# Patient Record
Sex: Female | Born: 1990 | Race: Black or African American | Hispanic: No | Marital: Single | Smoking: Never smoker
Health system: Southern US, Community
[De-identification: ages and names within clinical notes are randomized; demographics above are authoritative.]

## PROBLEM LIST (undated history)

## (undated) DIAGNOSIS — S060XAA Concussion with loss of consciousness status unknown, initial encounter: Secondary | ICD-10-CM

## (undated) DIAGNOSIS — F329 Major depressive disorder, single episode, unspecified: Secondary | ICD-10-CM

## (undated) DIAGNOSIS — M5020 Other cervical disc displacement, unspecified cervical region: Secondary | ICD-10-CM

## (undated) DIAGNOSIS — F32A Depression, unspecified: Secondary | ICD-10-CM

## (undated) DIAGNOSIS — R011 Cardiac murmur, unspecified: Secondary | ICD-10-CM

## (undated) DIAGNOSIS — R51 Headache: Secondary | ICD-10-CM

## (undated) DIAGNOSIS — S060X9A Concussion with loss of consciousness of unspecified duration, initial encounter: Secondary | ICD-10-CM

## (undated) DIAGNOSIS — R519 Headache, unspecified: Secondary | ICD-10-CM

## (undated) DIAGNOSIS — K219 Gastro-esophageal reflux disease without esophagitis: Secondary | ICD-10-CM

## (undated) DIAGNOSIS — F419 Anxiety disorder, unspecified: Secondary | ICD-10-CM

## (undated) HISTORY — DX: Concussion with loss of consciousness status unknown, initial encounter: S06.0XAA

## (undated) HISTORY — PX: WISDOM TOOTH EXTRACTION: SHX21

## (undated) HISTORY — DX: Other cervical disc displacement, unspecified cervical region: M50.20

## (undated) HISTORY — DX: Concussion with loss of consciousness of unspecified duration, initial encounter: S06.0X9A

## (undated) HISTORY — DX: Headache, unspecified: R51.9

## (undated) HISTORY — DX: Major depressive disorder, single episode, unspecified: F32.9

## (undated) HISTORY — DX: Gastro-esophageal reflux disease without esophagitis: K21.9

## (undated) HISTORY — PX: TONSILLECTOMY: SUR1361

## (undated) HISTORY — DX: Anxiety disorder, unspecified: F41.9

## (undated) HISTORY — DX: Headache: R51

## (undated) HISTORY — DX: Depression, unspecified: F32.A

---
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
## 2012-10-18 ENCOUNTER — Emergency Department (HOSPITAL_BASED_OUTPATIENT_CLINIC_OR_DEPARTMENT_OTHER)
Admission: EM | Admit: 2012-10-18 | Discharge: 2012-10-18 | Disposition: A | Discharge status: Home / Self Care | Payer: Self-pay | Attending: Emergency Medicine | Admitting: Emergency Medicine

## 2012-10-18 ENCOUNTER — Encounter (HOSPITAL_BASED_OUTPATIENT_CLINIC_OR_DEPARTMENT_OTHER): Payer: Self-pay | Admitting: *Deleted

## 2012-10-18 DIAGNOSIS — J029 Acute pharyngitis, unspecified: Secondary | ICD-10-CM

## 2012-10-18 LAB — RAPID STREP SCREEN (MED CTR MEBANE ONLY): Streptococcus, Group A Screen (Direct): NEGATIVE

## 2012-10-18 MED ORDER — AZITHROMYCIN 250 MG PO TABS: ORAL_TABLET | ORAL | Status: DC

## 2012-10-18 NOTE — ED Notes (Signed)
Sore throat and chills

## 2012-10-18 NOTE — ED Provider Notes (Signed)
History     CSN: [id number]  Arrival date & time 10/18/12  1725   First MD Initiated Contact with Patient 10/18/12 1848      Chief Complaint  Patient presents with  . Sore Throat    (Consider location/radiation/quality/duration/timing/severity/associated sxs/prior treatment) Patient is a 21 y.o. female presenting with pharyngitis. The history is provided by the patient. No language interpreter was used.  Sore Throat This is a new problem. Episode onset: 4 days. The problem occurs constantly. The problem has been gradually worsening. Associated symptoms include a sore throat and swollen glands. Pertinent negatives include no rash. Nothing aggravates the symptoms. She has tried nothing for the symptoms. The treatment provided moderate relief.  Pt complains of a sore throat and sinus drainage.    History reviewed. No pertinent past medical history.  Past Surgical History  Procedure Date  . Tonsillectomy     No family history on file.  History  Substance Use Topics  . Smoking status: Never Smoker   . Smokeless tobacco: Not on file  . Alcohol Use: No    OB History    Grav Para Term Preterm Abortions TAB SAB Ect Mult Living                  Review of Systems  HENT: Positive for sore throat.   Skin: Negative for rash.  All other systems reviewed and are negative.    Allergies  Sulfa antibiotics  Home Medications  No current outpatient prescriptions on file.  BP 113/68  Pulse 91  Temp 98.9 F (37.2 C) (Oral)  Resp 20  Wt 110 lb (49.896 kg)  SpO2 100%  Physical Exam  Nursing note and vitals reviewed. Constitutional: She is oriented to person, place, and time. She appears well-developed and well-nourished.  HENT:  Head: Normocephalic and atraumatic.  Right Ear: External ear normal.  Left Ear: External ear normal.  Nose: Nose normal.  Mouth/Throat: Oropharynx is clear and moist.  Eyes: Conjunctivae normal are normal. Pupils are equal, round, and reactive  to light.  Neck: Normal range of motion. Neck supple.  Cardiovascular: Normal rate, regular rhythm and normal heart sounds.   Pulmonary/Chest: Effort normal and breath sounds normal.  Musculoskeletal: Normal range of motion.  Neurological: She is alert and oriented to person, place, and time. She has normal reflexes.  Skin: Skin is warm.  Psychiatric: She has a normal mood and affect.    ED Course  Procedures (including critical care time)   Labs Reviewed  RAPID STREP SCREEN   No results found.   1. Pharyngitis       MDM   Pt given rx for zithromax       Elson Areas, [place] 10/18/12 1903  Lonia Skinner [place], [place] 10/18/12 1905

## 2012-10-19 NOTE — ED Provider Notes (Signed)
Medical screening examination/treatment/procedure(s) were performed by non-physician practitioner and as supervising physician I was immediately available for consultation/collaboration.   Carleene Cooper III, MD 10/19/12 [phone]

## 2014-09-29 DIAGNOSIS — R011 Cardiac murmur, unspecified: Secondary | ICD-10-CM | POA: Insufficient documentation

## 2014-09-29 DIAGNOSIS — F419 Anxiety disorder, unspecified: Secondary | ICD-10-CM | POA: Insufficient documentation

## 2015-09-12 LAB — HM PAP SMEAR: HM PAP: ABNORMAL

## 2015-11-03 ENCOUNTER — Encounter (HOSPITAL_BASED_OUTPATIENT_CLINIC_OR_DEPARTMENT_OTHER): Payer: Self-pay | Admitting: Emergency Medicine

## 2015-11-03 ENCOUNTER — Emergency Department (HOSPITAL_BASED_OUTPATIENT_CLINIC_OR_DEPARTMENT_OTHER)
Admission: EM | Admit: 2015-11-03 | Discharge: 2015-11-04 | Disposition: A | Discharge status: Home / Self Care | Payer: BLUE CROSS/BLUE SHIELD | Attending: Emergency Medicine | Admitting: Emergency Medicine

## 2015-11-03 ENCOUNTER — Emergency Department (HOSPITAL_BASED_OUTPATIENT_CLINIC_OR_DEPARTMENT_OTHER): Payer: BLUE CROSS/BLUE SHIELD

## 2015-11-03 DIAGNOSIS — R011 Cardiac murmur, unspecified: Secondary | ICD-10-CM | POA: Insufficient documentation

## 2015-11-03 DIAGNOSIS — S299XXA Unspecified injury of thorax, initial encounter: Secondary | ICD-10-CM | POA: Insufficient documentation

## 2015-11-03 DIAGNOSIS — Y92410 Unspecified street and highway as the place of occurrence of the external cause: Secondary | ICD-10-CM | POA: Diagnosis not present

## 2015-11-03 DIAGNOSIS — Z3202 Encounter for pregnancy test, result negative: Secondary | ICD-10-CM | POA: Diagnosis not present

## 2015-11-03 DIAGNOSIS — Y998 Other external cause status: Secondary | ICD-10-CM | POA: Diagnosis not present

## 2015-11-03 DIAGNOSIS — Y9389 Activity, other specified: Secondary | ICD-10-CM | POA: Insufficient documentation

## 2015-11-03 DIAGNOSIS — S0990XA Unspecified injury of head, initial encounter: Secondary | ICD-10-CM | POA: Diagnosis not present

## 2015-11-03 DIAGNOSIS — Z79899 Other long term (current) drug therapy: Secondary | ICD-10-CM | POA: Diagnosis not present

## 2015-11-03 DIAGNOSIS — S4992XA Unspecified injury of left shoulder and upper arm, initial encounter: Secondary | ICD-10-CM | POA: Diagnosis present

## 2015-11-03 DIAGNOSIS — S43102A Unspecified dislocation of left acromioclavicular joint, initial encounter: Secondary | ICD-10-CM | POA: Diagnosis not present

## 2015-11-03 HISTORY — DX: Cardiac murmur, unspecified: R01.1

## 2015-11-03 MED ORDER — IBUPROFEN 800 MG PO TABS
800.0000 mg | ORAL_TABLET | Freq: Once | ORAL | Status: AC
  Administered 2015-11-03: 800 mg via ORAL
  Filled 2015-11-03: qty 1
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
## 2015-11-04 LAB — PREGNANCY, URINE: PREG TEST UR: NEGATIVE

## 2015-11-04 MED ORDER — IBUPROFEN 800 MG PO TABS: 800.0000 mg | ORAL_TABLET | Freq: Three times a day (TID) | ORAL | Status: DC | PRN
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
## 2015-11-09 ENCOUNTER — Encounter: Payer: Self-pay | Admitting: Family Medicine

## 2015-11-09 ENCOUNTER — Ambulatory Visit (INDEPENDENT_AMBULATORY_CARE_PROVIDER_SITE_OTHER): Payer: Self-pay | Admitting: Family Medicine

## 2015-11-09 VITALS — BP 123/76 | HR 87 | Ht 61.0 in | Wt 120.0 lb

## 2015-11-09 DIAGNOSIS — S4992XA Unspecified injury of left shoulder and upper arm, initial encounter: Secondary | ICD-10-CM

## 2015-11-09 MED ORDER — METHOCARBAMOL 500 MG PO TABS: 500.0000 mg | ORAL_TABLET | Freq: Three times a day (TID) | ORAL | Status: DC | PRN

## 2015-11-09 MED ORDER — PREDNISONE 10 MG PO TABS: ORAL_TABLET | ORAL | Status: DC

## 2015-11-09 NOTE — Patient Instructions (Signed)
You suffered a Grade 2 AC sprain and a brachial plexus contusion. This should be the worst your pain is and it will improve over the next few weeks. Wear sling regularly for at least 1 more week. Prednisone dose pack as directed x 6 days - day AFTER finishing this you can restart the motrin. Ice or heat, whichever feels better for 15 minutes at a time. Robaxin as needed for muscle spasms. Shoulder motion exercises as I showed you. Follow up with me in 2 weeks. We will consider adding physical therapy at that time. Out of work in the meantime.

## 2015-11-10 DIAGNOSIS — S4992XA Unspecified injury of left shoulder and upper arm, initial encounter: Secondary | ICD-10-CM | POA: Insufficient documentation
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
## 2015-11-10 NOTE — Assessment & Plan Note (Signed)
she does have mild pain with AC joint testing though no tenderness - Grade 2 AC sprain that should improve well with conservative treatment.  Icing, sling for 1 more week.  Also with symptoms into left arm and weakness consistent with brachial plexus contusion or traction injury.  Start prednisone dose pack, robaxin as needed.  F/u in 2 weeks.  Out of work.  Consider starting PT at follow-up.

## 2015-11-23 ENCOUNTER — Encounter: Payer: Self-pay | Admitting: Family Medicine

## 2015-11-23 ENCOUNTER — Ambulatory Visit (INDEPENDENT_AMBULATORY_CARE_PROVIDER_SITE_OTHER): Payer: Self-pay | Admitting: Family Medicine

## 2015-11-23 VITALS — BP 114/79 | HR 80 | Ht 61.0 in | Wt 125.0 lb

## 2015-11-23 DIAGNOSIS — S060X0A Concussion without loss of consciousness, initial encounter: Secondary | ICD-10-CM

## 2015-11-23 DIAGNOSIS — S4992XD Unspecified injury of left shoulder and upper arm, subsequent encounter: Secondary | ICD-10-CM

## 2015-11-23 MED ORDER — AMITRIPTYLINE HCL 10 MG PO TABS: 10.0000 mg | ORAL_TABLET | Freq: Every day | ORAL | Status: DC

## 2015-11-23 MED ORDER — PROPRANOLOL HCL 60 MG PO TABS: 30.0000 mg | ORAL_TABLET | Freq: Three times a day (TID) | ORAL | Status: DC

## 2015-11-23 NOTE — Patient Instructions (Signed)
Your AC sprain of your shoulder has healed. You still have symptoms from the brachial plexus contusion. Tylenol 500mg  1-2 tabs three times a day as needed for pain. Motrin 600mg  three times a day with food as needed for pain and inflammation. Continue shoulder motion exercises.   Start amitriptyline 10mg  at bedtime for postconcussion syndrome. Start propranolol 30mg  three times a day for headaches. I would recommend picking one of the above to start today and waiting until Wednesday to start the other one so we make sure you don't sustain any side effects and, if you do, we know which of the two medicines it's from. Follow up with me in 2 weeks. Continued out of work.

## 2015-11-25 DIAGNOSIS — S060X0A Concussion without loss of consciousness, initial encounter: Secondary | ICD-10-CM | POA: Insufficient documentation

## 2015-11-25 NOTE — Assessment & Plan Note (Signed)
symptoms severe.  We discussed her options - will start amitriptyline, propranolol for postconcussive syndrome and headaches related to this.  F/u in 2 weeks for reevaluation.

## 2015-11-25 NOTE — Assessment & Plan Note (Signed)
AC sprain improved at this point.  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  No improvement with prednisone, robaxin.  She will continue with tylenol, motrin.  Amitriptyline will likely help with symptoms here.  Physical therapy a consideration at follow up.
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
## 2015-12-09 ENCOUNTER — Ambulatory Visit: Payer: Self-pay | Admitting: Family Medicine

## 2015-12-21 ENCOUNTER — Ambulatory Visit: Payer: Self-pay | Admitting: Family Medicine

## 2015-12-23 ENCOUNTER — Ambulatory Visit (INDEPENDENT_AMBULATORY_CARE_PROVIDER_SITE_OTHER): Payer: BLUE CROSS/BLUE SHIELD | Admitting: Family Medicine

## 2015-12-23 ENCOUNTER — Encounter: Payer: Self-pay | Admitting: Family Medicine

## 2015-12-23 VITALS — BP 114/76 | HR 96 | Ht 61.0 in | Wt 125.0 lb

## 2015-12-23 DIAGNOSIS — S4992XD Unspecified injury of left shoulder and upper arm, subsequent encounter: Secondary | ICD-10-CM

## 2015-12-23 DIAGNOSIS — S060X0D Concussion without loss of consciousness, subsequent encounter: Secondary | ICD-10-CM

## 2015-12-23 MED ORDER — PROPRANOLOL HCL ER 80 MG PO CP24: 80.0000 mg | ORAL_CAPSULE | Freq: Every day | ORAL | Status: DC

## 2015-12-23 MED ORDER — NORTRIPTYLINE HCL 10 MG PO CAPS: 10.0000 mg | ORAL_CAPSULE | Freq: Every day | ORAL | Status: DC

## 2015-12-23 NOTE — Patient Instructions (Addendum)
You are improving which is a good sign. Switch to nortriptyline 10mg  at bedtime - STOP amitriptyline. Switch to propranolol ER 80mg  once a day - STOP your other propranolol. If you are doing well but the headaches are still bothering you, you can take 2 of the propranolol once a day. Follow up with me in 1 month.

## 2015-12-24 NOTE — Assessment & Plan Note (Signed)
Did not repeat exam of these today, focused on concussion.  Has improved significantly since last visit.

## 2015-12-24 NOTE — Assessment & Plan Note (Signed)
symptoms severe but improved symptomatically and by exam today which is very encouraging.  We will switch to a longer acting form of propranolol for her headaches.  Switch to nortriptyline from amitriptyline given lesser side effect profile - less likely to make her sleepy.  F/u in 1 month.  Discussed can increase both of these medicines after a week if she's doing well.
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
## 2016-01-20 ENCOUNTER — Encounter: Payer: Self-pay | Admitting: Family Medicine

## 2016-01-20 ENCOUNTER — Ambulatory Visit (INDEPENDENT_AMBULATORY_CARE_PROVIDER_SITE_OTHER): Payer: BLUE CROSS/BLUE SHIELD | Admitting: Family Medicine

## 2016-01-20 VITALS — BP 107/75 | HR 70 | Ht 61.0 in | Wt 130.0 lb

## 2016-01-20 DIAGNOSIS — S060X0D Concussion without loss of consciousness, subsequent encounter: Secondary | ICD-10-CM

## 2016-01-20 MED ORDER — NORTRIPTYLINE HCL 25 MG PO CAPS: 25.0000 mg | ORAL_CAPSULE | Freq: Every day | ORAL | Status: DC

## 2016-01-20 NOTE — Patient Instructions (Addendum)
Try nortriptyline  at bedtime (you were previously on ) - I have sent this to your pharmacy. Increase propranolol from  once a day (1 tablet) to  once a day (2 tablets) We will arrange for you to see psychology for postconcussion syndrome. Call me in 1 week to let me know how you're doing and we will consider increase of these medicines. Follow up with me in 2 weeks.
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
## 2016-01-21 NOTE — Assessment & Plan Note (Signed)
symptoms severe and worsened from last visit unfortunately.  We will increase both the inderal and the nortriptyline.  Call us in 1 week with an update - can likely increase these medicines again at that time.  F/u in 2 weeks.  We will also add psychology referral for CBT for postconcussion syndrome.    Total visit time 20 minutes - half of which devoted to questions and counseling regarding her concussion.

## 2016-01-25 ENCOUNTER — Telehealth: Payer: Self-pay | Admitting: Family Medicine

## 2016-01-25 NOTE — Telephone Encounter (Signed)
I will need the claim number then.

## 2016-01-26 ENCOUNTER — Encounter: Payer: Self-pay | Admitting: Family Medicine

## 2016-01-26 NOTE — Telephone Encounter (Signed)
Letter printed.

## 2016-02-03 ENCOUNTER — Encounter: Payer: Self-pay | Admitting: Family Medicine

## 2016-02-03 ENCOUNTER — Ambulatory Visit (INDEPENDENT_AMBULATORY_CARE_PROVIDER_SITE_OTHER): Payer: BLUE CROSS/BLUE SHIELD | Admitting: Family Medicine

## 2016-02-03 VITALS — BP 97/59 | HR 81 | Ht 61.0 in | Wt 130.0 lb

## 2016-02-03 DIAGNOSIS — S060X0D Concussion without loss of consciousness, subsequent encounter: Secondary | ICD-10-CM | POA: Diagnosis not present

## 2016-02-03 NOTE — Patient Instructions (Signed)
Consider increasing the nortriptyline to 2 tablets of the  tabs. Melatonin  at bedtime. Keep your appointment with psychology on Friday and regular follow-ups. 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 Follow up with me in 2 weeks.

## 2016-02-04 NOTE — Assessment & Plan Note (Signed)
symptoms mildly improved from last visit.  Continue nortriptyline and inderal though discussed considering increasing nortriptyline to 2 tabs/caps at bedtime.  Keep psychology appointment for Friday.  Will try to return to work for 2 hours max - I think this may help her be less depressed and improve her mood symptoms if she can tolerate this return.  F/u in 2 weeks.    Total visit time 20 minutes - half of which devoted to questions and counseling regarding her concussion.
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
## 2016-02-05 ENCOUNTER — Ambulatory Visit (INDEPENDENT_AMBULATORY_CARE_PROVIDER_SITE_OTHER): Payer: BLUE CROSS/BLUE SHIELD | Admitting: Psychology

## 2016-02-05 DIAGNOSIS — F4323 Adjustment disorder with mixed anxiety and depressed mood: Secondary | ICD-10-CM | POA: Diagnosis not present

## 2016-02-15 ENCOUNTER — Ambulatory Visit (INDEPENDENT_AMBULATORY_CARE_PROVIDER_SITE_OTHER): Payer: BLUE CROSS/BLUE SHIELD | Admitting: Psychology

## 2016-02-15 DIAGNOSIS — F4323 Adjustment disorder with mixed anxiety and depressed mood: Secondary | ICD-10-CM

## 2016-02-18 ENCOUNTER — Ambulatory Visit: Payer: BLUE CROSS/BLUE SHIELD | Admitting: Family Medicine

## 2016-02-23 ENCOUNTER — Encounter: Payer: Self-pay | Admitting: Family Medicine

## 2016-02-23 ENCOUNTER — Ambulatory Visit (INDEPENDENT_AMBULATORY_CARE_PROVIDER_SITE_OTHER): Payer: BLUE CROSS/BLUE SHIELD | Admitting: Family Medicine

## 2016-02-23 ENCOUNTER — Encounter (INDEPENDENT_AMBULATORY_CARE_PROVIDER_SITE_OTHER): Payer: Self-pay

## 2016-02-23 VITALS — BP 123/85 | HR 93 | Ht 61.0 in | Wt 144.0 lb

## 2016-02-23 DIAGNOSIS — S060X0D Concussion without loss of consciousness, subsequent encounter: Secondary | ICD-10-CM | POA: Diagnosis not present

## 2016-02-23 NOTE — Patient Instructions (Signed)
Continue your nortriptyline, inderal at their current doses. Keep appointments with psychology. I don't think it's a good idea to return to 4 hour shifts at work yet. Consider an MRI though based on your exam this is likely to be normal - call me if you want to go ahead with this. Otherwise follow up with me in 4 weeks.

## 2016-02-29 ENCOUNTER — Ambulatory Visit (INDEPENDENT_AMBULATORY_CARE_PROVIDER_SITE_OTHER): Payer: BLUE CROSS/BLUE SHIELD | Admitting: Psychology

## 2016-02-29 DIAGNOSIS — F4323 Adjustment disorder with mixed anxiety and depressed mood: Secondary | ICD-10-CM

## 2016-02-29 NOTE — Assessment & Plan Note (Signed)
symptoms essentially unchanged from last visit.  Continue nortriptyline and inderal, continue psychology appointments.  We discussed MRI given length of symptoms and not much change - she will think about this and let us know otherwise f/u in 4 weeks.
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
## 2016-03-14 ENCOUNTER — Ambulatory Visit (INDEPENDENT_AMBULATORY_CARE_PROVIDER_SITE_OTHER): Payer: BLUE CROSS/BLUE SHIELD | Admitting: Psychology

## 2016-03-14 DIAGNOSIS — F4323 Adjustment disorder with mixed anxiety and depressed mood: Secondary | ICD-10-CM | POA: Diagnosis not present

## 2016-03-29 ENCOUNTER — Encounter: Payer: Self-pay | Admitting: Behavioral Health

## 2016-03-29 ENCOUNTER — Telehealth: Payer: Self-pay | Admitting: Behavioral Health

## 2016-03-29 NOTE — Telephone Encounter (Signed)
Pre-Visit Call completed with patient and chart updated.   Pre-Visit Info documented in Specialty Comments under [employer].    

## 2016-03-30 ENCOUNTER — Encounter: Payer: Self-pay | Admitting: Family Medicine

## 2016-03-30 ENCOUNTER — Ambulatory Visit (INDEPENDENT_AMBULATORY_CARE_PROVIDER_SITE_OTHER): Payer: BLUE CROSS/BLUE SHIELD | Admitting: Family Medicine

## 2016-03-30 ENCOUNTER — Other Ambulatory Visit: Payer: Self-pay | Admitting: Family Medicine

## 2016-03-30 VITALS — BP 104/80 | HR 100 | Temp 98.7°F | Ht 62.5 in | Wt 142.2 lb

## 2016-03-30 DIAGNOSIS — G4710 Hypersomnia, unspecified: Secondary | ICD-10-CM

## 2016-03-30 DIAGNOSIS — F0781 Postconcussional syndrome: Secondary | ICD-10-CM | POA: Diagnosis not present

## 2016-03-30 DIAGNOSIS — J45990 Exercise induced bronchospasm: Secondary | ICD-10-CM

## 2016-03-30 LAB — CBC WITH DIFFERENTIAL/PLATELET
BASOS PCT: 0.6 % (ref 0.0–3.0)
Basophils Absolute: 0 10*3/uL (ref 0.0–0.1)
EOS ABS: 0.3 10*3/uL (ref 0.0–0.7)
EOS PCT: 4.4 % (ref 0.0–5.0)
HEMATOCRIT: 40.1 % (ref 36.0–46.0)
HEMOGLOBIN: 13.4 g/dL (ref 12.0–15.0)
LYMPHS PCT: 28.4 % (ref 12.0–46.0)
Lymphs Abs: 1.7 10*3/uL (ref 0.7–4.0)
MCHC: 33.4 g/dL (ref 30.0–36.0)
MCV: 84.3 fl (ref 78.0–100.0)
MONO ABS: 0.6 10*3/uL (ref 0.1–1.0)
Monocytes Relative: 10.5 % (ref 3.0–12.0)
Neutro Abs: 3.4 10*3/uL (ref 1.4–7.7)
Neutrophils Relative %: 56.1 % (ref 43.0–77.0)
Platelets: 292 10*3/uL (ref 150.0–400.0)
RBC: 4.76 Mil/uL (ref 3.87–5.11)
RDW: 13.2 % (ref 11.5–15.5)
WBC: 6.1 10*3/uL (ref 4.0–10.5)

## 2016-03-30 LAB — COMPREHENSIVE METABOLIC PANEL
ALBUMIN: 4.1 g/dL (ref 3.5–5.2)
ALK PHOS: 102 U/L (ref 39–117)
ALT: 36 U/L — ABNORMAL HIGH (ref 0–35)
AST: 27 U/L (ref 0–37)
BILIRUBIN TOTAL: 0.3 mg/dL (ref 0.2–1.2)
BUN: 7 mg/dL (ref 6–23)
CALCIUM: 9.5 mg/dL (ref 8.4–10.5)
CO2: 30 mEq/L (ref 19–32)
CREATININE: 0.67 mg/dL (ref 0.40–1.20)
Chloride: 103 mEq/L (ref 96–112)
GFR: 139.95 mL/min (ref >60.00)
Glucose, Bld: 90 mg/dL (ref 70–99)
Potassium: 4 mEq/L (ref 3.5–5.1)
SODIUM: 139 meq/L (ref 135–145)
TOTAL PROTEIN: 7.1 g/dL (ref 6.0–8.3)

## 2016-03-30 LAB — HEMOGLOBIN A1C: HEMOGLOBIN A1C: 5.3 % (ref 4.6–6.5)

## 2016-03-30 MED ORDER — ALBUTEROL SULFATE HFA 108 (90 BASE) MCG/ACT IN AERS: 2.0000 | INHALATION_SPRAY | Freq: Four times a day (QID) | RESPIRATORY_TRACT | Status: DC | PRN
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
## 2016-03-30 NOTE — Progress Notes (Signed)
Pre visit review using our clinic tool,if applicable. No additional management support is needed unless otherwise documented below in the visit note.  

## 2016-03-30 NOTE — Patient Instructions (Signed)
Try the albuterol (1-2 puffs) prior to exercise.  If this does NOT help with your exercised induced shortness of breath let me konw!  I am going to refer you to neurology (dr. Vickey Hugerohmeier) to discuss your persistent concussion sx and sleepiness We will check some blood work for you today and I will be in touch asap

## 2016-03-31 ENCOUNTER — Other Ambulatory Visit: Payer: Self-pay | Admitting: *Deleted

## 2016-03-31 MED ORDER — NORTRIPTYLINE HCL 25 MG PO CAPS: 25.0000 mg | ORAL_CAPSULE | Freq: Every day | ORAL | Status: DC

## 2016-04-04 ENCOUNTER — Ambulatory Visit (INDEPENDENT_AMBULATORY_CARE_PROVIDER_SITE_OTHER): Payer: BLUE CROSS/BLUE SHIELD | Admitting: Psychology

## 2016-04-04 DIAGNOSIS — F4323 Adjustment disorder with mixed anxiety and depressed mood: Secondary | ICD-10-CM

## 2016-04-07 ENCOUNTER — Ambulatory Visit (INDEPENDENT_AMBULATORY_CARE_PROVIDER_SITE_OTHER): Payer: BLUE CROSS/BLUE SHIELD | Admitting: Neurology

## 2016-04-07 ENCOUNTER — Encounter: Payer: Self-pay | Admitting: Neurology

## 2016-04-07 VITALS — BP 112/74 | HR 80 | Resp 16 | Ht 61.0 in | Wt 141.0 lb

## 2016-04-07 DIAGNOSIS — G2581 Restless legs syndrome: Secondary | ICD-10-CM | POA: Diagnosis not present

## 2016-04-07 DIAGNOSIS — G478 Other sleep disorders: Secondary | ICD-10-CM

## 2016-04-07 DIAGNOSIS — R419 Unspecified symptoms and signs involving cognitive functions and awareness: Secondary | ICD-10-CM | POA: Diagnosis not present

## 2016-04-07 DIAGNOSIS — G4750 Parasomnia, unspecified: Secondary | ICD-10-CM | POA: Diagnosis not present

## 2016-04-07 DIAGNOSIS — G4710 Hypersomnia, unspecified: Secondary | ICD-10-CM

## 2016-04-07 DIAGNOSIS — R443 Hallucinations, unspecified: Secondary | ICD-10-CM | POA: Diagnosis not present

## 2016-04-07 DIAGNOSIS — G4761 Periodic limb movement disorder: Secondary | ICD-10-CM | POA: Diagnosis not present

## 2016-04-07 DIAGNOSIS — G44309 Post-traumatic headache, unspecified, not intractable: Secondary | ICD-10-CM | POA: Diagnosis not present

## 2016-04-07 DIAGNOSIS — R442 Other hallucinations: Secondary | ICD-10-CM

## 2016-04-07 NOTE — Patient Instructions (Addendum)
I believe you have a sleep condition, which makes you very sleepy. This could be narcolepsy or a disorder called idiopathic hypersomnolence. This means, that you have a sleep disorder that manifests with excessive sleep and excessive sleepiness during the day. We may have to try different medications that may help you stay awake during the day. Not everything works with everybody the same way. Wake promoting agents include stimulants and non-stimulant type medications. The most common side effects with stimulants are weight loss, insomnia, nervousness, headaches, palpitations, rise in blood pressure, anxiety. Stimulants can be addictive and subject to abuse. Non-stimulant type wake promoting medications include Provigil and Nuvigil, most common side effects include headaches, nervousness, insomnia, hypertension.   We will have to do more testing, sleep study and nap study, during the day.   For the sleep study, you have to be off of the zoloft and the nortriptyline for at least 2 weeks, please check with Drs. Hudnall and Conard Novakisenberg if it is okay to taper these. If okay with the other doctors, reduce zoloft and nortriptyline in half for a week. The stop the nortriptyline and reduce zoloft to 1/4 pill for 1 week, then stop.   Post-concussion syndrome is a complex disorder which presents with a variety of symptoms, including headache, dizziness, cognitive issues, balance issues, mood related symptoms and motor issues. These can last for weeks and sometimes months after the initial head injury. Concussion is a mild form of traumatic brain injury, and usually occurs after a blow to the head. Loss of consciousness is not a requirement for the diagnosis of concussion or of postconcussion syndrome. The risk of post concussive symptoms does not appear to be correlated with the severity of the initial injury. Most people who have a concussion are without any residual symptoms. Some patients have symptoms that occur  within the first 7-10 days of the initial injury and had complete resolution of her symptoms within 3 months. However, keep in mind that postconcussive symptoms can persist for a year and sometimes even longer. There is no specific test for the diagnosis of concussion. There is no specific treatment for concussion or postconcussive symptoms and care is supportive. Treatment is geared towards improving symptoms. Medications commonly used for migraines or tension headaches, including some antidepressants, appear to be helpful with postconcussive headaches. Medications include amitriptyline, Topamax, or gabapentin and others. Keep in mind that overuse of over-the-counter pain medications can exacerbate post concussion headaches.  There are no medications currently recommended specifically for cognitive complaints after mild traumatic brain injury. Usually, time is the best treatment for postconcussive cognitive issues as most of the cognitive complaints resolve on their own in the first few weeks or months after the injury. Sometimes a referral to a psychiatrist or psychologist is helpful. Certain forms of cognitive therapy may be helpful, and relaxation therapy may also help.  We will consider a brain MRI in the future.   Your exam looks good.
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
## 2016-04-11 ENCOUNTER — Ambulatory Visit (HOSPITAL_BASED_OUTPATIENT_CLINIC_OR_DEPARTMENT_OTHER)
Admission: RE | Admit: 2016-04-11 | Discharge: 2016-04-11 | Disposition: A | Discharge status: Home / Self Care | Payer: BLUE CROSS/BLUE SHIELD | Source: Ambulatory Visit | Attending: Family Medicine | Admitting: Family Medicine

## 2016-04-11 ENCOUNTER — Encounter: Payer: Self-pay | Admitting: Family Medicine

## 2016-04-11 ENCOUNTER — Ambulatory Visit (INDEPENDENT_AMBULATORY_CARE_PROVIDER_SITE_OTHER): Payer: BLUE CROSS/BLUE SHIELD | Admitting: Family Medicine

## 2016-04-11 VITALS — BP 111/78 | HR 88 | Ht 61.0 in | Wt 145.0 lb

## 2016-04-11 DIAGNOSIS — S060X0D Concussion without loss of consciousness, subsequent encounter: Secondary | ICD-10-CM | POA: Diagnosis not present

## 2016-04-11 DIAGNOSIS — M542 Cervicalgia: Secondary | ICD-10-CM | POA: Insufficient documentation

## 2016-04-11 MED ORDER — DIAZEPAM 5 MG PO TABS: ORAL_TABLET | ORAL | Status: DC

## 2016-04-11 NOTE — Patient Instructions (Signed)
Get x-rays downstairs of your neck before you leave today. We will call you with these results and set up MRIs of your brain and cervical spine. Take nortriptyline only 25mg  at bedtime for 1 week then stop so you're ready to do the sleep study in 3 weeks. We will talk about follow-up on the phone after the MRIs.
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
## 2016-04-11 NOTE — Assessment & Plan Note (Signed)
symptoms essentially unchanged from last visit.  Wean off nortriptyline for sleep study but continue the propranolol, continue psychology appointments.  MRI of brain given protracted course - discussed this will likely be normal.
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
## 2016-04-13 NOTE — Addendum Note (Signed)
Addended by: Kathi SimpersWISE, Riata Ikeda F on: 04/13/2016 12:01 PM   Modules accepted: Orders

## 2016-04-16 ENCOUNTER — Ambulatory Visit (HOSPITAL_BASED_OUTPATIENT_CLINIC_OR_DEPARTMENT_OTHER)
Admission: RE | Admit: 2016-04-16 | Discharge: 2016-04-16 | Disposition: A | Discharge status: Home / Self Care | Payer: BLUE CROSS/BLUE SHIELD | Source: Ambulatory Visit | Attending: Family Medicine | Admitting: Family Medicine

## 2016-04-16 DIAGNOSIS — R51 Headache: Secondary | ICD-10-CM | POA: Diagnosis not present

## 2016-04-16 DIAGNOSIS — X58XXXD Exposure to other specified factors, subsequent encounter: Secondary | ICD-10-CM | POA: Diagnosis not present

## 2016-04-16 DIAGNOSIS — M50223 Other cervical disc displacement at C6-C7 level: Secondary | ICD-10-CM | POA: Diagnosis not present

## 2016-04-16 DIAGNOSIS — M542 Cervicalgia: Secondary | ICD-10-CM

## 2016-04-16 DIAGNOSIS — S060X0D Concussion without loss of consciousness, subsequent encounter: Secondary | ICD-10-CM

## 2016-04-29 ENCOUNTER — Ambulatory Visit (INDEPENDENT_AMBULATORY_CARE_PROVIDER_SITE_OTHER): Payer: BLUE CROSS/BLUE SHIELD | Admitting: Psychology

## 2016-04-29 DIAGNOSIS — F4323 Adjustment disorder with mixed anxiety and depressed mood: Secondary | ICD-10-CM

## 2016-05-05 ENCOUNTER — Encounter: Payer: Self-pay | Admitting: Family Medicine

## 2016-05-05 ENCOUNTER — Ambulatory Visit (INDEPENDENT_AMBULATORY_CARE_PROVIDER_SITE_OTHER): Payer: BLUE CROSS/BLUE SHIELD | Admitting: Family Medicine

## 2016-05-05 VITALS — BP 102/67 | HR 58 | Ht 61.0 in | Wt 145.0 lb

## 2016-05-05 DIAGNOSIS — S060X0D Concussion without loss of consciousness, subsequent encounter: Secondary | ICD-10-CM

## 2016-05-05 DIAGNOSIS — M542 Cervicalgia: Secondary | ICD-10-CM

## 2016-05-05 NOTE — Patient Instructions (Signed)
Call the neurologist to ask about options for the sleep study. If it's going to be a long time before you get this or you can't get this done, I'd go back on your nortriptyline and the zoloft. Call me after you talk to them. I'd like to increase the medicines if you do go back on them. Continue with seeing the psychologist as well. Follow up in 1 month otherwise.

## 2016-05-06 NOTE — Assessment & Plan Note (Signed)
decreased headaches, more emotional though likely the result of stopping medication in preparation for sleep study.  Advised to call neurologist to discuss this further - if she's not going to have this done she should restart the nortriptyline and her zoloft.  Continue propranolol, psychology appointments.  MRI normal as expected.
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
## 2016-05-17 ENCOUNTER — Telehealth: Payer: Self-pay | Admitting: Neurology

## 2016-05-17 NOTE — Telephone Encounter (Signed)
Please advise patient or mother that for chronic neck pain, she is best to FU with her PCP or request a referral to an orthopedic specialist. I do not see any sinister issues, when I reviewed the C spine MRI report or the brain MRI report.  We may want to schedule her sleep studies at a later date, maybe towards the end of this year, if better, financially.

## 2016-05-17 NOTE — Telephone Encounter (Signed)
Pt's mother called sts sleep test will cost her $2700, she can't afford this.  Also pt had cervical MRI 04/16/16- she is wanting to know if Dr Frances FurbishAthar would read over the report and see the pt for this condition. Please call

## 2016-05-18 NOTE — Telephone Encounter (Signed)
I spoke to patient and she is aware of recommendations below. Voiced understanding.

## 2016-05-20 ENCOUNTER — Ambulatory Visit (INDEPENDENT_AMBULATORY_CARE_PROVIDER_SITE_OTHER): Payer: BLUE CROSS/BLUE SHIELD | Admitting: Psychology

## 2016-05-20 DIAGNOSIS — F4323 Adjustment disorder with mixed anxiety and depressed mood: Secondary | ICD-10-CM

## 2016-05-24 ENCOUNTER — Other Ambulatory Visit: Payer: Self-pay | Admitting: Family Medicine

## 2016-06-06 ENCOUNTER — Encounter: Payer: Self-pay | Admitting: Family Medicine

## 2016-06-06 ENCOUNTER — Ambulatory Visit (INDEPENDENT_AMBULATORY_CARE_PROVIDER_SITE_OTHER): Payer: BLUE CROSS/BLUE SHIELD | Admitting: Family Medicine

## 2016-06-06 VITALS — BP 110/72 | HR 89 | Ht 61.0 in | Wt 145.0 lb

## 2016-06-06 DIAGNOSIS — M542 Cervicalgia: Secondary | ICD-10-CM | POA: Diagnosis not present

## 2016-06-06 DIAGNOSIS — S060X0D Concussion without loss of consciousness, subsequent encounter: Secondary | ICD-10-CM

## 2016-06-06 MED ORDER — NORTRIPTYLINE HCL 75 MG PO CAPS: 75.0000 mg | ORAL_CAPSULE | Freq: Every day | ORAL | Status: DC

## 2016-06-06 MED ORDER — PROPRANOLOL HCL ER 120 MG PO CP24: 120.0000 mg | ORAL_CAPSULE | Freq: Every day | ORAL | Status: DC

## 2016-06-06 NOTE — Patient Instructions (Addendum)
Take nortriptyline 75mg  at bedtime. After a week you could increase the propranolol to 240mg  daily (I've sent in a prescription for this as well - this is the medicine for headaches). Start physical therapy for your neck pain.  Hopefully this will help with the headaches as well. Follow up with me about 1 month after starting physical therapy.

## 2016-06-08 NOTE — Assessment & Plan Note (Signed)
Headaches continue to slowly improve.  Symptom score still high last visit along with severity.  Will increase the nortriptyline and inderal to max doses.  Continue psychology appointments.
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
## 2016-06-09 ENCOUNTER — Ambulatory Visit: Payer: BLUE CROSS/BLUE SHIELD | Attending: Family Medicine | Admitting: Physical Therapy

## 2016-06-09 DIAGNOSIS — M542 Cervicalgia: Secondary | ICD-10-CM | POA: Diagnosis present

## 2016-06-09 DIAGNOSIS — R293 Abnormal posture: Secondary | ICD-10-CM
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
## 2016-06-09 NOTE — Patient Instructions (Signed)
Flexibility: Upper Trapezius Stretch    Gently grasp right side of head and tilt head away until a gentle stretch is felt. Hold __20-30__ seconds. Repeat __2-3__ times per set. Do _1___ sets per session. Do __2-3__ sessions per day.  http://orth.exer.us/341   Copyright  VHI. All rights reserved.    Levator Scapula Stretch, Sitting    Sit, one hand tucked under hip on side to be stretched, other hand over top of head. Turn head toward other side and look down. Look at left armpit.  Use hand on head to gently stretch neck in that position. Hold __20-30_ seconds. Repeat _2-3__ times per session. Do _2-3__ sessions per day.  Copyright  VHI. All rights reserved.    Scapular Retraction (Standing)    With arms at sides, pinch shoulder blades together. Repeat _15___ times per set. Do __1__ sets per session. Do __2-3__ sessions per day.  http://orth.exer.us/945   Copyright  VHI. All rights reserved.

## 2016-06-10 ENCOUNTER — Ambulatory Visit (INDEPENDENT_AMBULATORY_CARE_PROVIDER_SITE_OTHER): Payer: BLUE CROSS/BLUE SHIELD | Admitting: Psychology

## 2016-06-10 DIAGNOSIS — F4323 Adjustment disorder with mixed anxiety and depressed mood: Secondary | ICD-10-CM | POA: Diagnosis not present

## 2016-06-16 ENCOUNTER — Encounter: Payer: Self-pay | Admitting: Rehabilitative and Restorative Service Providers"

## 2016-06-16 ENCOUNTER — Ambulatory Visit
Payer: BLUE CROSS/BLUE SHIELD | Attending: Family Medicine | Admitting: Rehabilitative and Restorative Service Providers"

## 2016-06-16 DIAGNOSIS — M542 Cervicalgia: Secondary | ICD-10-CM

## 2016-06-16 DIAGNOSIS — R293 Abnormal posture: Secondary | ICD-10-CM
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
## 2016-06-16 NOTE — Patient Instructions (Addendum)
Scapula Adduction With Pectoralis Stretch: Low - Standing   Shoulders at 45 hands even with shoulders, keeping weight through legs, shift weight forward until you feel pull or stretch through the front of your chest. Hold _30__ seconds. Do _3__ times, _2-4__ times per day.   Scapula Adduction With Pectoralis Stretch: Mid-Range - Standing   Shoulders at 90 elbows even with shoulders, keeping weight through legs, shift weight forward until you feel pull or strength through the front of your chest. Hold __30_ seconds. Do _3__ times, __2-4_ times per day.   Scapula Adduction With Pectoralis Stretch: High - Standing   Shoulders at 120 hands up high on the doorway, keeping weight on feet, shift weight forward until you feel pull or stretch through the front of your chest. Hold _30__ seconds. Do _3__ times, _2-3__ times per day.   TENS UNIT: This is helpful for muscle pain and spasm.   Search and Purchase a TENS 7000 2nd edition at www.tenspros.com. It should be less than $30.     TENS unit instructions: Do not shower or bathe with the unit on Turn the unit off before removing electrodes or batteries If the electrodes lose stickiness add a drop of water to the electrodes after they are disconnected from the unit and place on plastic sheet. If you continued to have difficulty, call the TENS unit company to purchase more electrodes. Do not apply lotion on the skin area prior to use. Make sure the skin is clean and dry as this will help prolong the life of the electrodes. After use, always check skin for unusual red areas, rash or other skin difficulties. If there are any skin problems, does not apply electrodes to the same area. Never remove the electrodes from the unit by pulling the wires. Do not use the TENS unit or electrodes other than as directed. Do not change electrode placement without consultating your therapist or physician. Keep 2 fingers with between each  electrode.   Trigger Point Dry Needling  . What is Trigger Point Dry Needling (DN)? o DN is a physical therapy technique used to treat muscle pain and dysfunction. Specifically, DN helps deactivate muscle trigger points (muscle knots).  o A thin filiform needle is used to penetrate the skin and stimulate the underlying trigger point. The goal is for a local twitch response (LTR) to occur and for the trigger point to relax. No medication of any kind is injected during the procedure.   . What Does Trigger Point Dry Needling Feel Like?  o The procedure feels different for each individual patient. Some patients report that they do not actually feel the needle enter the skin and overall the process is not painful. Very mild bleeding may occur. However, many patients feel a deep cramping in the muscle in which the needle was inserted. This is the local twitch response.   Marland Kitchen. How Will I feel after the treatment? o Soreness is normal, and the onset of soreness may not occur for a few hours. Typically this soreness does not last longer than two days.  o Bruising is uncommon, however; ice can be used to decrease any possible bruising.  o In rare cases feeling tired or nauseous after the treatment is normal. In addition, your symptoms may get worse before they get better, this period will typically not last longer than 24 hours.   . What Can I do After My Treatment? o Increase your hydration by drinking more water for the next 24 hours.  o You may place ice or heat on the areas treated that have become sore, however, do not use heat on inflamed or bruised areas. Heat often brings more relief post needling. o You can continue your regular activities, but vigorous activity is not recommended initially after the treatment for 24 hours. o DN is best combined with other physical therapy such as strengthening, stretching, and other therapies.     

## 2016-06-21 ENCOUNTER — Ambulatory Visit: Payer: BLUE CROSS/BLUE SHIELD | Admitting: Physical Therapy

## 2016-06-21 DIAGNOSIS — M542 Cervicalgia: Secondary | ICD-10-CM | POA: Diagnosis not present

## 2016-06-21 DIAGNOSIS — R293 Abnormal posture: Secondary | ICD-10-CM

## 2016-06-21 NOTE — Therapy (Signed)
[facility] [place] [address]  [address] [place], [place], [address] Phone: [phone]   Fax:  [phone]  Physical Therapy Treatment  Patient Details  Name: Martha Campos MRN: [id number] Date of Birth: 1991-08-04 Referring Provider: Dr. Norton BlizzardShane Hudnall  Encounter Date: 06/21/2016      PT End of Session - 06/21/16 1603    Visit Number 3   Number of Visits 12   Date for PT Re-Evaluation 07/21/16   PT Start Time 1523   PT Stop Time 1615   PT Time Calculation (min) 52 min   Activity Tolerance Patient tolerated treatment well   Behavior During Therapy [facility] for tasks assessed/performed      Past Medical History  Diagnosis Date  . Murmur   . Concussion   . Ruptured disc, cervical   . GERD (gastroesophageal reflux disease)     As an infant  . Depression   . Anxiety   . Headache     Past Surgical History  Procedure Laterality Date  . Tonsillectomy    . Wisdom tooth extraction      There were no vitals filed for this visit.      Subjective Assessment - 06/21/16 1522    Subjective "I actually feel good."  has some pain on R side of neck and small headache but doing well   Patient Stated Goals improve pain; get back to "normal ways"   Currently in Pain? Yes   Pain Score 3    Pain Location Neck   Pain Orientation Right   Pain Descriptors / Indicators Sore   Pain Type Chronic pain   Pain Radiating Towards upper trap   Aggravating Factors  standing, walking, turning head up and to right   Pain Relieving Factors lying down, e stim                         [facility] Adult PT Treatment/Exercise - 06/21/16 1528    Neck Exercises: Machines for Strengthening   UBE (Upper Arm Bike) L 2.0 x 6 min (3' fwd/3' bwd)   Neck Exercises: Supine   Neck Retraction 5 reps;5 secs   Shoulder Flexion Both;10 reps   Shoulder Flexion Limitations yellow theraband on 1/2 foam rolll   Shoulder ABduction Both;10 reps   Shoulder Abduction Limitations yellow theraband on 1/2 foam roll; horizontal abduction   Other Supine Exercise ext rotation with yellow theraband x 10   Shoulder Exercises: Standing   Extension Both;10 reps;Theraband   Theraband Level (Shoulder Extension) Level 1 (Yellow)   Retraction Both;10 reps;Theraband   Theraband Level (Shoulder Retraction) Level 1 (Yellow)   Moist Heat Therapy   Number Minutes Moist Heat 12 Minutes   Moist Heat Location Cervical   Electrical Stimulation   Electrical Stimulation Location Rt lateral cervical musculature; Lt cervical; Lt anterior shoulder   Electrical Stimulation Action IFCIFC   Electrical Stimulation Parameters to tolerance   Electrical Stimulation Goals Pain   Manual Therapy   Manual therapy comments patient supine- hooklying    Soft tissue mobilization anterior lateral posterior cervical musculature; upper trap R side only   Neck Exercises: Stretches   Chest Stretch 2 reps;30 seconds                     PT Long Term Goals - 06/16/16 1722    PT LONG TERM GOAL #1   Title independent with HEP (07/21/16)   Time 6  Period Weeks   Status On-going   PT LONG TERM GOAL #2   Title perform c-spine AROM without pain for improved function (07/21/16)   Time 6   Period Weeks   Status On-going   PT LONG TERM GOAL #3   Title report abilty to stand and walk > 30 min without increase in pain for improved function and anticipate return to work (07/21/16)   Time 6   Period Weeks   Status On-going   PT LONG TERM GOAL #4   Title verbalize understanding of posture/body mechanics to decrease risk of reinjury (07/21/16)   Time 6   Period Weeks   Status On-going               Plan - 06/21/16 1603    Clinical Impression Statement Pt tolerated session well today without increase in pain.  Reports manual traction was uncomfortable last session so did not perform today.  Pt states she is open to dry needling after thinking about it.   Instructed that TDN can cease at any point if she is uncomfortable and pt verbalized understanding.  Pain decreasing and overall progressing well towards goals.   PT Next Visit Plan review and progress with HEP; manual, modalities PRN, ?dry needling; UE strengthening and postural exercises   Consulted and Agree with Plan of Care Patient      Patient will benefit from skilled therapeutic intervention in order to improve the following deficits and impairments:  Postural dysfunction, Pain, Hypomobility, Decreased strength  Visit Diagnosis: Cervicalgia  Abnormal posture     Problem List Patient Active Problem List   Diagnosis Date Noted  . Neck pain 04/11/2016  . Concussion without loss of consciousness 11/25/2015  . Injury of left shoulder 11/10/2015  . Anxiety 09/29/2014  . Cardiac murmur 09/29/2014   Martha Campos, PT, DPT 06/21/2016 4:15 PM   [facility] [address]  [address] [place], [place], [address] Phone: [phone]   Fax:  [phone]  Name: Martha Campos MRN: [id number] Date of Birth: 29-Aug-1991

## 2016-06-23 ENCOUNTER — Ambulatory Visit: Payer: BLUE CROSS/BLUE SHIELD | Admitting: Rehabilitative and Restorative Service Providers"

## 2016-06-23 ENCOUNTER — Encounter: Payer: Self-pay | Admitting: Rehabilitative and Restorative Service Providers"

## 2016-06-23 DIAGNOSIS — R293 Abnormal posture: Secondary | ICD-10-CM

## 2016-06-23 DIAGNOSIS — M542 Cervicalgia: Secondary | ICD-10-CM

## 2016-06-23 NOTE — Therapy (Signed)
[facility] Outpatient Rehabilitation [facility] [address]  [address] [place], [place], [address] Phone: [phone]   Fax:  [phone]  Physical Therapy Treatment  Patient Details  Name: Martha Campos MRN: [id number] Date of Birth: February 23, 1991 Referring Provider: Dr. Norton Blizzard  Encounter Date: 06/23/2016      PT End of Session - 06/23/16 1624    Visit Number 4   Number of Visits 12   Date for PT Re-Evaluation 07/21/16   PT Start Time 1625   PT Stop Time 1715   PT Time Calculation (min) 50 min   Activity Tolerance Patient tolerated treatment well      Past Medical History  Diagnosis Date  . Murmur   . Concussion   . Ruptured disc, cervical   . GERD (gastroesophageal reflux disease)     As an infant  . Depression   . Anxiety   . Headache     Past Surgical History  Procedure Laterality Date  . Tonsillectomy    . Wisdom tooth extraction      There were no vitals filed for this visit.      Subjective Assessment - 06/23/16 1625    Subjective Feels "pretty good". The last treatment seemed to help some. Still having a "little tightness' in the Rt neck and into the shoulder area   Currently in Pain? Yes   Pain Score 4    Pain Location Neck   Pain Orientation Right   Pain Descriptors / Indicators Sore   Pain Type Chronic pain   Pain Onset More than a month ago   Pain Frequency Intermittent   Aggravating Factors  standing; walking; turning head up and to the right    Pain Relieving Factors lying down; TENS unit                          [facility] Adult PT Treatment/Exercise - 06/23/16 0001    Neck Exercises: Standing   Neck Retraction 5 reps;10 secs   Neck Exercises: Supine   Neck Retraction 5 reps;10 secs   Shoulder Exercises: Stretch   Other Shoulder Stretches 3way doorway stretch 30 sec x 3    Moist Heat Therapy   Number Minutes Moist Heat 20 Minutes   Moist Heat Location Cervical   Electrical Stimulation   Electrical Stimulation Location Rt lateral cervical musculature; Lt cervical; Lt trap   Electrical Stimulation Action IFC   Electrical Stimulation Parameters to tolerance   Electrical Stimulation Goals Tone;Pain   Manual Therapy   Manual therapy comments ; prone    Soft tissue mobilization anterior lateral posterior cervical musculature; upper trap R side only   Myofascial Release anterior chest/shds           Trigger Point Dry Needling - 06/23/16 1707    Muscles Treated Upper Body --  cervical paraspinals; scaleni Rt; cervical paraspinals Lt -    Upper Trapezius Response Twitch reponse elicited;Palpable increased muscle length                   PT Long Term Goals - 06/23/16 1711    PT LONG TERM GOAL #1   Title independent with HEP (07/21/16)   Time 6   Period Weeks   Status On-going   PT LONG TERM GOAL #2   Title perform c-spine AROM without pain for improved function (07/21/16)   Time 6   Period Weeks   Status On-going   PT LONG  TERM GOAL #3   Title report abilty to stand and walk > 30 min without increase in pain for improved function and anticipate return to work (07/21/16)   Time 6   Period Weeks   Status On-going   PT LONG TERM GOAL #4   Title verbalize understanding of posture/body mechanics to decrease risk of reinjury (07/21/16)   Time 6   Period Weeks   Status On-going               Plan - 06/23/16 1709    Clinical Impression Statement tolerated TDN well for treatment. Would benefit from additional muscles/needling as she tolerates. Good release of muscular tightness with TDN - c/o soreness.    Rehab Potential Good   PT Frequency 2x / week   PT Duration 6 weeks   PT Treatment/Interventions ADLs/Self Care Home Management;Cryotherapy;Electrical Stimulation;Moist Heat;Traction;Ultrasound;Patient/family education;Neuromuscular re-education;Therapeutic exercise;Therapeutic activities;Functional mobility training;Manual techniques;Passive range of  motion;Dry needling;Taping   PT Next Visit Plan review and progress with HEP; manual, modalities PRN, assess response dry needling; UE strengthening and postural exercises   Consulted and Agree with Plan of Care Patient      Patient will benefit from skilled therapeutic intervention in order to improve the following deficits and impairments:  Postural dysfunction, Pain, Hypomobility, Decreased strength  Visit Diagnosis: Cervicalgia  Abnormal posture     Problem List Patient Active Problem List   Diagnosis Date Noted  . Neck pain 04/11/2016  . Concussion without loss of consciousness 11/25/2015  . Injury of left shoulder 11/10/2015  . Anxiety 09/29/2014  . Cardiac murmur 09/29/2014    Candies Palm Rober MinionP Majed Pellegrin PT, MPH  06/23/2016, 5:12 PM  [facility] [address]  [address] [place], [place], [address] Phone: [phone]   Fax:  [phone]  Name: Martha Campos MRN: [id number] Date of Birth: 03/10/1991

## 2016-06-27 ENCOUNTER — Ambulatory Visit: Payer: BLUE CROSS/BLUE SHIELD | Admitting: Physical Therapy

## 2016-06-27 DIAGNOSIS — M542 Cervicalgia: Secondary | ICD-10-CM

## 2016-06-27 DIAGNOSIS — R293 Abnormal posture: Secondary | ICD-10-CM

## 2016-06-27 NOTE — Patient Instructions (Signed)
Gaze Stabilization: Sitting    Keeping eyes on target on wall 3-5 feet away, tilt head down 15-30 and move head side to side for __20-30__ seconds. Repeat while moving head up and down for __20-30__ seconds. Do __2__ sessions per day.   Copyright  VHI. All rights reserved.    Gaze Stabilization: Tip Card  1.Target must remain in focus, not blurry, and appear stationary while head is in motion. 2.Perform exercises with small head movements (45 to either side of midline). 3.Increase speed of head motion so long as target is in focus. 4.If you wear eyeglasses, be sure you can see target through lens (therapist will give specific instructions for bifocal / progressive lenses). 5.These exercises may provoke dizziness or nausea. Work through these symptoms. If too dizzy, slow head movement slightly. Rest between each exercise. 6.Exercises demand concentration; avoid distractions.  Copyright  VHI. All rights reserved.

## 2016-06-27 NOTE — Therapy (Signed)
[facility] [address]  [address] [place], [place], [address] Phone: [phone]   Fax:  [phone]  Physical Therapy Treatment  Patient Details  Name: Martha Campos MRN: [id number] Date of Birth: 12/04/1991 Referring Provider: Dr. Norton BlizzardShane Hudnall  Encounter Date: 06/27/2016      PT End of Session - 06/27/16 1608    Visit Number 5   Number of Visits 12   Date for PT Re-Evaluation 07/21/16   PT Start Time 1530   PT Stop Time 1617   PT Time Calculation (min) 47 min   Activity Tolerance Patient tolerated treatment well   Behavior During Therapy [facility] for tasks assessed/performed      Past Medical History  Diagnosis Date  . Murmur   . Concussion   . Ruptured disc, cervical   . GERD (gastroesophageal reflux disease)     As an infant  . Depression   . Anxiety   . Headache     Past Surgical History  Procedure Laterality Date  . Tonsillectomy    . Wisdom tooth extraction      There were no vitals filed for this visit.      Subjective Assessment - 06/27/16 1534    Subjective feels much better; not having as much pain.  had some pain this morning bending over (looking down)   Patient Stated Goals improve pain; get back to "normal ways"   Currently in Pain? Yes   Pain Score 1    Pain Location Neck   Pain Orientation Right   Pain Descriptors / Indicators Sore   Pain Type Chronic pain   Pain Radiating Towards upper trap   Pain Onset More than a month ago   Pain Frequency Intermittent   Aggravating Factors  standing, walking, looking up and to the right   Pain Relieving Factors lying down, TENS unit                         [facility] Treatment/Exercise - 06/27/16 1537    Neck Exercises: Machines for Strengthening   UBE (Upper Arm Bike) L 2.0 x 6 min (3' fwd/3' bwd)   Neck Exercises: Theraband   Scapula Retraction 10 reps;Green   Rows 10 reps;Green   Horizontal ABduction 10 reps;Red    Horizontal ABduction Limitations with 1/2 foam roll   Other Theraband Exercises shoulder flexion with red theraband and 1/2 foam roll x 10   Moist Heat Therapy   Number Minutes Moist Heat 12 Minutes   Moist Heat Location Cervical   Electrical Stimulation   Electrical Stimulation Location upper thoracic spine   Electrical Stimulation Action IFC   Electrical Stimulation Parameters to tolerance   Electrical Stimulation Goals Tone;Pain   Neck Exercises: Stretches   Chest Stretch 3 reps;30 seconds         Vestibular Treatment/Exercise - 06/27/16 1545    Vestibular Treatment/Exercise   Gaze Exercises X1 Viewing Horizontal;X1 Viewing Vertical   X1 Viewing Horizontal   Foot Position seated   Time --  30 sec   Reps 1   Comments cues to maintain gaze and for technique; attempted standing with and without full field stimulus but pt unable to tolerate   X1 Viewing Vertical   Foot Position seated   Time --  30 sec   Reps 1   Comments cues to maintain gaze and for technique; attempted standing with and without full field stimulus but pt unable  to tolerate                    PT Long Term Goals - 06/23/16 1711    PT LONG TERM GOAL #1   Title independent with HEP (07/21/16)   Time 6   Period Weeks   Status On-going   PT LONG TERM GOAL #2   Title perform c-spine AROM without pain for improved function (07/21/16)   Time 6   Period Weeks   Status On-going   PT LONG TERM GOAL #3   Title report abilty to stand and walk > 30 min without increase in pain for improved function and anticipate return to work (07/21/16)   Time 6   Period Weeks   Status On-going   PT LONG TERM GOAL #4   Title verbalize understanding of posture/body mechanics to decrease risk of reinjury (07/21/16)   Time 6   Period Weeks   Status On-going               Plan - 06/27/16 1608    Clinical Impression Statement Pt reports significant improvement in symptoms following TDN.  Issued gaze x 1  viewing due to some residual vestibular symptoms.  Difficulty maintaining gaze and mod cues for technique.   PT Next Visit Plan review and progress with HEP; manual, modalities PRN, assess response dry needling; UE strengthening and postural exercises   Consulted and Agree with Plan of Care Patient      Patient will benefit from skilled therapeutic intervention in order to improve the following deficits and impairments:  Postural dysfunction, Pain, Hypomobility, Decreased strength  Visit Diagnosis: Cervicalgia  Abnormal posture     Problem List Patient Active Problem List   Diagnosis Date Noted  . Neck pain 04/11/2016  . Concussion without loss of consciousness 11/25/2015  . Injury of left shoulder 11/10/2015  . Anxiety 09/29/2014  . Cardiac murmur 09/29/2014   Clarita Crane, PT, DPT 06/27/2016 4:18 PM  [facility] [address]  [address] [place], [place], [address] Phone: [phone]   Fax:  [phone]  Name: Martha Campos MRN: [id number] Date of Birth: Feb 19, 1991

## 2016-06-30 ENCOUNTER — Encounter: Payer: Self-pay | Admitting: Rehabilitative and Restorative Service Providers"

## 2016-06-30 ENCOUNTER — Ambulatory Visit: Payer: BLUE CROSS/BLUE SHIELD | Admitting: Rehabilitative and Restorative Service Providers"

## 2016-06-30 DIAGNOSIS — R293 Abnormal posture: Secondary | ICD-10-CM

## 2016-06-30 DIAGNOSIS — M542 Cervicalgia: Secondary | ICD-10-CM | POA: Diagnosis not present

## 2016-06-30 NOTE — Therapy (Signed)
[facility] Out[facility] Rehabilitation [facility] [address]  [address] [place], [place], [address] Phone: [phone]   Fax:  [phone]  Physical Therapy Treatment  Patient Details  Name: Martha Campos MRN: [id number] Date of Birth: 05/28/1991 Referring Provider: Dr. Norton Blizzard  Encounter Date: 06/30/2016      PT End of Session - 06/30/16 1611    Visit Number 6   Number of Visits 12   Date for PT Re-Evaluation 07/21/16   PT Start Time 1409   PT Stop Time 1500   PT Time Calculation (min) 51 min   Activity Tolerance Patient tolerated treatment well      Past Medical History  Diagnosis Date  . Murmur   . Concussion   . Ruptured disc, cervical   . GERD (gastroesophageal reflux disease)     As an infant  . Depression   . Anxiety   . Headache     Past Surgical History  Procedure Laterality Date  . Tonsillectomy    . Wisdom tooth extraction      There were no vitals filed for this visit.      Subjective Assessment - 06/30/16 1611    Subjective feeling better - still has some tightness through the Rt side of her neck. Feels theTDN helped and would like to do that again. Trying to work on the vestibular exercises - make her dizzy but she is still working on them    Currently in Pain? Yes   Pain Score 2    Pain Location Neck   Pain Orientation Right   Pain Descriptors / Indicators Sore   Pain Type Chronic pain   Pain Onset More than a month ago   Pain Frequency Intermittent                         [facility] Adult PT Treatment/Exercise - 06/30/16 0001    Neck Exercises: Theraband   Scapula Retraction 10 reps;Green   Rows 10 reps;Green   Horizontal ABduction 10 reps;Red   Horizontal ABduction Limitations with 1/2 foam roll   Shoulder Exercises: Stretch   Other Shoulder Stretches 3way doorway stretch 30 sec x 3    Moist Heat Therapy   Number Minutes Moist Heat 15 Minutes   Moist Heat Location Cervical   Electrical  Stimulation   Electrical Stimulation Location Rt/Lt upper cervical; Rt lower cervical x2    Electrical Stimulation Action IFC   Electrical Stimulation Parameters to tolerance   Electrical Stimulation Goals Tone;Pain   Manual Therapy   Manual therapy comments prone and supine    Soft tissue mobilization anterior lateral posterior cervical musculature; upper trap R side only   Myofascial Release anterior chest/shds    Manual Traction cervical min/mod pull x 20-30 sec intermittently through manual work           Trigger [employer] Needling - 06/30/16 1654    Consent Given? Yes   Education Handout Provided Yes   Muscles Treated Upper Body --  Rt/Lt cervical paraspinals; scaleni Rt twitch/dec tightness   Sternocleidomastoid Response Twitch response elicited;Palpable increased muscle length   Upper Trapezius Response Twitch reponse elicited;Palpable increased muscle length   SubOccipitals Response Twitch response elicited;Palpable increased muscle length   Levator Scapulae Response Twitch response elicited;Palpable increased muscle length                   PT Long Term Goals - 06/30/16 1613    PT  LONG TERM GOAL #1   Title independent with HEP (07/21/16)   Time 6   Period Weeks   Status On-going   PT LONG TERM GOAL #2   Title perform c-spine AROM without pain for improved function (07/21/16)   Time 6   Period Weeks   Status On-going   PT LONG TERM GOAL #3   Title report abilty to stand and walk > 30 min without increase in pain for improved function and anticipate return to work (07/21/16)   Time 6   Period Weeks   Status On-going   PT LONG TERM GOAL #4   Title verbalize understanding of posture/body mechanics to decrease risk of reinjury (07/21/16)   Time 6   Period Weeks   Status On-going               Plan - 06/30/16 1656    Clinical Impression Statement Improved tissue extensibility; decreased pain and tightness. Progressing well with rehab   Rehab  Potential Good   PT Frequency 2x / week   PT Duration 6 weeks   PT Treatment/Interventions ADLs/Self Care Home Management;Cryotherapy;Electrical Stimulation;Moist Heat;Traction;Ultrasound;Patient/family education;Neuromuscular re-education;Therapeutic exercise;Therapeutic activities;Functional mobility training;Manual techniques;Passive range of motion;Dry needling;Taping   PT Next Visit Plan review and progress with HEP; manual, modalities PRN, assess response dry needling; UE strengthening and postural exercises   Consulted and Agree with Plan of Care Patient      Patient will benefit from skilled therapeutic intervention in order to improve the following deficits and impairments:  Postural dysfunction, Pain, Hypomobility, Decreased strength  Visit Diagnosis: Cervicalgia  Abnormal posture     Problem List Patient Active Problem List   Diagnosis Date Noted  . Neck pain 04/11/2016  . Concussion without loss of consciousness 11/25/2015  . Injury of left shoulder 11/10/2015  . Anxiety 09/29/2014  . Cardiac murmur 09/29/2014    Celyn Rober MinionP Holt PT, MPH  06/30/2016, 4:58 PM  [facility] [address]  [address] [place], [place], [address] Phone: [phone]   Fax:  [phone]  Name: Barb MerinoDominique Speaker MRN: [id number] Date of Birth: 1991/01/31

## 2016-07-01 ENCOUNTER — Ambulatory Visit (INDEPENDENT_AMBULATORY_CARE_PROVIDER_SITE_OTHER): Payer: BLUE CROSS/BLUE SHIELD | Admitting: Psychology

## 2016-07-01 DIAGNOSIS — F4323 Adjustment disorder with mixed anxiety and depressed mood: Secondary | ICD-10-CM | POA: Diagnosis not present

## 2016-07-04 ENCOUNTER — Ambulatory Visit: Payer: BLUE CROSS/BLUE SHIELD | Admitting: Physical Therapy

## 2016-07-04 DIAGNOSIS — R293 Abnormal posture: Secondary | ICD-10-CM

## 2016-07-04 DIAGNOSIS — M542 Cervicalgia: Secondary | ICD-10-CM | POA: Diagnosis not present

## 2016-07-04 NOTE — Therapy (Signed)
[facility] Outpatient Rehabilitation [facility] [address]  [address] [place], [place], [address] Phone: [phone]   Fax:  [phone]  Physical Therapy Treatment  Patient Details  Name: Martha Campos MRN: [id number] Date of Birth: 01/13/1991 Referring Provider: Dr. Norton Blizzard  Encounter Date: 07/04/2016      PT End of Session - 07/04/16 1616    Visit Number 7   Number of Visits 12   Date for PT Re-Evaluation 07/21/16   PT Start Time 1534   PT Stop Time 1615   PT Time Calculation (min) 41 min   Activity Tolerance Patient tolerated treatment well;No increased pain   Behavior During Therapy WFL for tasks assessed/performed      Past Medical History:  Diagnosis Date  . Anxiety   . Concussion   . Depression   . GERD (gastroesophageal reflux disease)    As an infant  . Headache   . Murmur   . Ruptured disc, cervical     Past Surgical History:  Procedure Laterality Date  . TONSILLECTOMY    . WISDOM TOOTH EXTRACTION      There were no vitals filed for this visit.      Subjective Assessment - 07/04/16 1535    Subjective feels like dry needling has really helped.  has a headache currently.   Pertinent History depression, anxiety   Limitations Standing;Walking;House hold activities   Patient Stated Goals improve pain; get back to "normal ways"   Currently in Pain? Yes   Pain Score 2    Pain Location Neck   Pain Orientation Right   Pain Descriptors / Indicators Sore   Pain Type Chronic pain   Pain Radiating Towards upper trap   Pain Onset More than a month ago   Pain Frequency Intermittent   Aggravating Factors  looking up and to right   Pain Relieving Factors lying down, estim   Multiple Pain Sites Yes   Pain Score 3   Pain Location Head   Pain Orientation Right   Pain Descriptors / Indicators Aching;Headache   Pain Onset Today   Aggravating Factors  unknown   Pain Relieving Factors unknown                          [facility] Adult PT Treatment/Exercise - 07/04/16 1540      Neck Exercises: Machines for Strengthening   UBE (Upper Arm Bike) L 2.5 x 6 min (3' fwd/3' bwd)     Neck Exercises: Supine   Cervical Isometrics Right lateral flexion;Left lateral flexion;Left rotation;Right rotation;5 secs;10 reps   Neck Retraction 15 reps;5 secs     Shoulder Exercises: Standing   Horizontal ABduction Both;15 reps;Weights   Horizontal ABduction Weight (lbs) 1  in each hand   Horizontal ABduction Limitations with pool noodle along spine   External Rotation Both;15 reps;Weights;Limitations   External Rotation Weight (lbs) 1  in each hand   External Rotation Limitations with pool noodle along spine   Flexion Both;15 reps;Weights   Shoulder Flexion Weight (lbs) 1  in each hand   Flexion Limitations with pool noodle along spine     Shoulder Exercises: Stretch   Other Shoulder Stretches 3way doorway stretch 30 sec x 2     Manual Therapy   Soft tissue mobilization anterior lateral posterior cervical musculature; upper trap R side only   Manual Traction cervical min/mod pull x 20-30 sec intermittently through manual work  PT Long Term Goals - 06/30/16 1613      PT LONG TERM GOAL #1   Title independent with HEP (07/21/16)   Time 6   Period Weeks   Status On-going     PT LONG TERM GOAL #2   Title perform c-spine AROM without pain for improved function (07/21/16)   Time 6   Period Weeks   Status On-going     PT LONG TERM GOAL #3   Title report abilty to stand and walk > 30 min without increase in pain for improved function and anticipate return to work (07/21/16)   Time 6   Period Weeks   Status On-going     PT LONG TERM GOAL #4   Title verbalize understanding of posture/body mechanics to decrease risk of reinjury (07/21/16)   Time 6   Period Weeks   Status On-going               Plan - 07/04/16 1617    Clinical  Impression Statement Pt reports improvement in pain but concerned about pain returning after PT ends.  Educated on goals of PT and option for holding when appropriate.  Progressing well towards goals.   PT Next Visit Plan review and progress with HEP; manual, modalities PRN, assess response dry needling; UE strengthening and postural exercises      Patient will benefit from skilled therapeutic intervention in order to improve the following deficits and impairments:  Postural dysfunction, Pain, Hypomobility, Decreased strength  Visit Diagnosis: Cervicalgia  Abnormal posture     Problem List Patient Active Problem List   Diagnosis Date Noted  . Neck pain 04/11/2016  . Concussion without loss of consciousness 11/25/2015  . Injury of left shoulder 11/10/2015  . Anxiety 09/29/2014  . Cardiac murmur 09/29/2014     Clarita Crane, PT, DPT 07/04/16 4:18 PM   [facility] [address]  [address] [place], [place], [address] Phone: [phone]   Fax:  [phone]  Name: Martha Campos MRN: [id number] Date of Birth: September 20, 1991

## 2016-07-07 ENCOUNTER — Ambulatory Visit: Payer: BLUE CROSS/BLUE SHIELD | Admitting: Rehabilitative and Restorative Service Providers"

## 2016-07-07 ENCOUNTER — Encounter: Payer: Self-pay | Admitting: Rehabilitative and Restorative Service Providers"

## 2016-07-07 DIAGNOSIS — R293 Abnormal posture: Secondary | ICD-10-CM

## 2016-07-07 DIAGNOSIS — M542 Cervicalgia: Secondary | ICD-10-CM | POA: Diagnosis not present

## 2016-07-07 NOTE — Therapy (Signed)
[facility] [address]  [address] [place], [place], [address] Phone: [phone]   Fax:  [phone]  Physical Therapy Treatment  Patient Details  Name: Martha [facility] MRN: [id number] Date of Birth: August 31, 1991 Referring Provider: Dr. Norton Blizzard  Encounter Date: 07/07/2016      PT End of Session - 07/07/16 1541    Visit Number 8   Number of Visits 12   Date for PT Re-Evaluation 07/21/16   PT Start Time 1537   PT Stop Time 1627   PT Time Calculation (min) 50 min   Activity Tolerance Patient tolerated treatment well      Past Medical History:  Diagnosis Date  . Anxiety   . Concussion   . Depression   . GERD (gastroesophageal reflux disease)    As an infant  . Headache   . Murmur   . Ruptured disc, cervical     Past Surgical History:  Procedure Laterality Date  . TONSILLECTOMY    . WISDOM TOOTH EXTRACTION      There were no vitals filed for this visit.      Subjective Assessment - 07/07/16 1542    Subjective Continues to have headache whichshe has had since Monday. Feels like it starts in the back her head and comes around the head. Tries to sleep but that doesn't help.    Currently in Pain? Yes   Pain Score 4    Pain Location Neck   Pain Orientation Right   Pain Descriptors / Indicators Sore   Pain Type Chronic pain                         [facility] Adult PT Treatment/Exercise - 07/07/16 0001      Neck Exercises: Machines for Strengthening   UBE (Upper Arm Bike) L 2.5 x 6 min (3' fwd/3' bwd)     Moist Heat Therapy   Number Minutes Moist Heat 15 Minutes   Moist Heat Location Cervical     Electrical Stimulation   Electrical Stimulation Location bilat occipital area/lower cervical paraspinals    Electrical Stimulation Action IFC   Electrical Stimulation Parameters to tolerance   Electrical Stimulation Goals Tone;Pain     Manual Therapy   Manual therapy comments prone and  supine    Soft tissue mobilization anterior lateral posterior cervical musculature; upper trap bilat    Passive ROM passive cervical stretching into flexion and flexion with slight rotation to each side    Manual Traction cervical min/mod pull x 20-30 sec intermittently through manual work           Trigger [employer] Needling - 07/07/16 1617    Consent Given? Yes   Muscles Treated Upper Body --  bilat - to address headache/muscular tightness    Oblique Capitus Response Twitch response elicited;Palpable increased muscle length   Longissimus Response Twitch response elicited;Palpable increased muscle length  cervical paraspinals                    PT Long Term Goals - 06/30/16 1613      PT LONG TERM GOAL #1   Title independent with HEP (07/21/16)   Time 6   Period Weeks   Status On-going     PT LONG TERM GOAL #2   Title perform c-spine AROM without pain for improved function (07/21/16)   Time 6   Period Weeks   Status On-going  PT LONG TERM GOAL #3   Title report abilty to stand and walk > 30 min without increase in pain for improved function and anticipate return to work (07/21/16)   Time 6   Period Weeks   Status On-going     PT LONG TERM GOAL #4   Title verbalize understanding of posture/body mechanics to decrease risk of reinjury (07/21/16)   Time 6   Period Weeks   Status On-going               Plan - 07/07/16 1619    Clinical Impression Statement Continued headache since Monday. note muscular tightness through the occipital and cervical paraspinals.  Increased soreness with TDN - which is typical for pt's response to TDN  - will assess response at next visit.    Rehab Potential Good   PT Frequency 2x / week   PT Duration 6 weeks   PT Treatment/Interventions ADLs/Self Care Home Management;Cryotherapy;Electrical Stimulation;Moist Heat;Traction;Ultrasound;Patient/family education;Neuromuscular re-education;Therapeutic exercise;Therapeutic  activities;Functional mobility training;Manual techniques;Passive range of motion;Dry needling;Taping   PT Next Visit Plan progress with HEP; manual, modalities PRN, assess response dry needling; UE strengthening and postural exercises   Consulted and Agree with Plan of Care Patient      Patient will benefit from skilled therapeutic intervention in order to improve the following deficits and impairments:  Postural dysfunction, Pain, Hypomobility, Decreased strength  Visit Diagnosis: Cervicalgia  Abnormal posture     Problem List Patient Active Problem List   Diagnosis Date Noted  . Neck pain 04/11/2016  . Concussion without loss of consciousness 11/25/2015  . Injury of left shoulder 11/10/2015  . Anxiety 09/29/2014  . Cardiac murmur 09/29/2014    Reneisha Stilley Rober Minion PT, MPH  07/07/2016, 4:21 PM  [facility] [address]  [address] [place], [place], [address] Phone: [phone]   Fax:  [phone]  Name: Martha [facility] MRN: [id number] Date of Birth: 04-09-1991

## 2016-07-13 ENCOUNTER — Telehealth: Payer: Self-pay | Admitting: Physical Therapy

## 2016-07-13 ENCOUNTER — Ambulatory Visit: Payer: BLUE CROSS/BLUE SHIELD | Attending: Family Medicine | Admitting: Physical Therapy

## 2016-07-13 DIAGNOSIS — R293 Abnormal posture: Secondary | ICD-10-CM | POA: Diagnosis present

## 2016-07-13 DIAGNOSIS — M542 Cervicalgia: Secondary | ICD-10-CM | POA: Diagnosis present

## 2016-07-13 NOTE — Telephone Encounter (Signed)
Feel pt would benefit from home TENS unit.  If you agree please write order.   Thanks-  Clarita Crane, PT, DPT 07/13/16 2:47 PM

## 2016-07-13 NOTE — Therapy (Signed)
[facility] [address]  [address] [place], [place], [address] Phone: [phone]   Fax:  [phone]  Physical Therapy Treatment  Patient Details  Name: Martha Campos MRN: [id number] Date of Birth: 05-10-1991 Referring Provider: Dr. Norton Blizzard  Encounter Date: 07/13/2016      PT End of Session - 07/13/16 1515    Visit Number 9   Number of Visits 12   Date for PT Re-Evaluation 07/21/16   PT Start Time 1430   PT Stop Time 1519   PT Time Calculation (min) 49 min   Activity Tolerance Patient tolerated treatment well   Behavior During Therapy [facility] for tasks assessed/performed      Past Medical History:  Diagnosis Date  . Anxiety   . Concussion   . Depression   . GERD (gastroesophageal reflux disease)    As an infant  . Headache   . Murmur   . Ruptured disc, cervical     Past Surgical History:  Procedure Laterality Date  . TONSILLECTOMY    . WISDOM TOOTH EXTRACTION      There were no vitals filed for this visit.      Subjective Assessment - 07/13/16 1429    Subjective still having a headache; but neck is feeling better - still on right side. "I'm slowly improving."   Pertinent History depression, anxiety   Limitations Standing;Walking;House hold activities   Diagnostic tests x ray: negaitve; MRI: bulging disc   Patient Stated Goals improve pain; get back to "normal ways"   Currently in Pain? Yes   Pain Score 3    Pain Location Neck   Pain Orientation Right   Pain Descriptors / Indicators Sore   Pain Type Chronic pain   Pain Radiating Towards upper trap   Pain Onset More than a month ago   Pain Frequency Intermittent   Aggravating Factors  looking up and to right   Pain Relieving Factors lying down, estim                         [facility] Adult PT Treatment/Exercise - 07/13/16 1432      Neck Exercises: Machines for Strengthening   UBE (Upper Arm Bike) L 4 x 6 min (3' fwd/3' bwd)      Neck Exercises: Supine   Cervical Isometrics Right lateral flexion;Left lateral flexion;Left rotation;Right rotation;5 secs;10 reps   Cervical Isometrics Limitations on 1/2 foam roll   Neck Retraction 15 reps;5 secs   Neck Retraction Limitations on 1/2 foam roll     Shoulder Exercises: Stretch   Other Shoulder Stretches 3way doorway stretch 30 sec x 2   Other Shoulder Stretches supine on 1/2 foam roll     Moist Heat Therapy   Number Minutes Moist Heat 15 Minutes   Moist Heat Location Cervical     Electrical Stimulation   Electrical Stimulation Location bil upper thoracic   Electrical Stimulation Action IFC   Electrical Stimulation Parameters to tolerance   Electrical Stimulation Goals Tone;Pain     Manual Therapy   Manual therapy comments seated   Soft tissue mobilization anterior lateral posterior cervical musculature; upper trap bilat                      PT Long Term Goals - 06/30/16 1613      PT LONG TERM GOAL #1   Title independent with HEP (07/21/16)   Time 6  Period Weeks   Status On-going     PT LONG TERM GOAL #2   Title perform c-spine AROM without pain for improved function (07/21/16)   Time 6   Period Weeks   Status On-going     PT LONG TERM GOAL #3   Title report abilty to stand and walk > 30 min without increase in pain for improved function and anticipate return to work (07/21/16)   Time 6   Period Weeks   Status On-going     PT LONG TERM GOAL #4   Title verbalize understanding of posture/body mechanics to decrease risk of reinjury (07/21/16)   Time 6   Period Weeks   Status On-going               Plan - 07/13/16 1515    Clinical Impression Statement Pt slowly responding with decreased neck pain rated 3/10 today.  Feel pt will benefit from home TENS unit so requested order from MD.  Pt plans to schedule follow up with MD this week to discuss progress.  Feel pt may need additional interventions due to persistent headache and  visual changes likely from concussion. Plan to renew next week x 3-4 weeks to maximize potential.   Rehab Potential Good   PT Treatment/Interventions ADLs/Self Care Home Management;Cryotherapy;Electrical Stimulation;Moist Heat;Traction;Ultrasound;Patient/family education;Neuromuscular re-education;Therapeutic exercise;Therapeutic activities;Functional mobility training;Manual techniques;Passive range of motion;Dry needling;Taping   PT Next Visit Plan progress with HEP; manual, modalities PRN, assess response dry needling; UE strengthening and postural exercises; will need renewal   Consulted and Agree with Plan of Care Patient      Patient will benefit from skilled therapeutic intervention in order to improve the following deficits and impairments:  Postural dysfunction, Pain, Hypomobility, Decreased strength  Visit Diagnosis: Cervicalgia  Abnormal posture     Problem List Patient Active Problem List   Diagnosis Date Noted  . Neck pain 04/11/2016  . Concussion without loss of consciousness 11/25/2015  . Injury of left shoulder 11/10/2015  . Anxiety 09/29/2014  . Cardiac murmur 09/29/2014       Clarita Crane, PT, DPT 07/13/16 3:19 PM    [facility] [address]  [address] [place], [place], [address] Phone: [phone]   Fax:  [phone]  Name: Maudine Kluesner MRN: [id number] Date of Birth: 1990-12-18

## 2016-07-18 ENCOUNTER — Ambulatory Visit: Payer: BLUE CROSS/BLUE SHIELD | Admitting: Physical Therapy

## 2016-07-18 DIAGNOSIS — M542 Cervicalgia: Secondary | ICD-10-CM

## 2016-07-18 DIAGNOSIS — R293 Abnormal posture: Secondary | ICD-10-CM

## 2016-07-18 NOTE — Therapy (Signed)
[facility] [place] [address]  [address] [place], [place], [address] Phone: [phone]   Fax:  [phone]  Physical Therapy Treatment  Patient Details  Name: Martha MerinoDominique Denbow MRN: [id number] Date of Birth: 08-Mar-1991 Referring Provider: Dr. Norton BlizzardShane Hudnall  Encounter Date: 07/18/2016      PT End of Session - 07/18/16 1601    Visit Number 10   Number of Visits 12   Date for PT Re-Evaluation 07/21/16   PT Start Time 1530   PT Stop Time 1608   PT Time Calculation (min) 38 min   Activity Tolerance No increased pain;Patient limited by pain   Behavior [facility] for tasks assessed/performed      Past Medical History:  Diagnosis Date  . Anxiety   . Concussion   . Depression   . GERD (gastroesophageal reflux disease)    As an infant  . Headache   . Murmur   . Ruptured disc, cervical     Past Surgical History:  Procedure Laterality Date  . TONSILLECTOMY    . WISDOM TOOTH EXTRACTION      There were no vitals filed for this visit.      Subjective Assessment - 07/18/16 1533    Subjective had a quiet weekend; has a headache today.  pain starts in neck and goes up into head.   Pertinent History depression, anxiety   Limitations Standing;Walking;House hold activities   How Martha can you stand comfortably? 10-15 min   How Martha can you walk comfortably? 10-15 min   Diagnostic tests x ray: negaitve; MRI: bulging disc   Patient Stated Goals improve pain; get back to "normal ways"   Currently in Pain? Yes   Pain Score 4    Pain Location Neck   Pain Orientation Right   Pain Descriptors / Indicators Headache   Pain Type Chronic pain   Pain Radiating Towards head   Pain Frequency Intermittent   Aggravating Factors  looking up and to right   Pain Relieving Factors lying down, estim            [facility] PT Assessment - 07/18/16 0001      Observation/Other Assessments   Focus on Therapeutic Outcomes (FOTO)  59 (41%  limited)     AROM   Cervical Flexion 44   Cervical Extension 37   Cervical - Right Side Bend 30   Cervical - Left Side Bend 30   Cervical - Right Rotation 70   Cervical - Left Rotation 65        Self Care: Martha discussion with progress, POC, and goals of care.  Plan to follow up with pt after MD appt but anticipate d/c due to limited progress with PT.  Recommend possible concussion rehab.             [facility]/Exercise - 07/18/16 1538      Neck Exercises: Machines for Strengthening   UBE (Upper Arm Bike) L 4 x 6 min (3' fwd/3' bwd)     Modalities   Modalities Traction     Traction   Type of Traction Cervical   Min (lbs) 5   Max (lbs) 15   Hold Time 60   Rest Time 20   Time 12 min                     PT Martha Term Goals - 07/18/16 1601      PT Martha TERM GOAL #  1   Title independent with HEP (07/21/16)   Status On-going     PT Martha TERM GOAL #2   Title perform c-spine AROM without pain for improved function (07/21/16)   Baseline pain continued with all motions; and ROM decreased due to headache   Status On-going     PT Martha TERM GOAL #3   Title report abilty to stand and walk > 30 min without increase in pain for improved function and anticipate return to work (07/21/16)   Baseline reports only able to tolerate 10-15 min (essentially unchanged from eval)   Status On-going     PT Martha TERM GOAL #4   Title verbalize understanding of posture/body mechanics to decrease risk of reinjury (07/21/16)   Status On-going               Plan - 07/18/16 1609    Clinical Impression Statement Pt reports slight improvement in symptoms but overall all testing revealed essentially unchanged function.  FOTO score and cervical side bending and rotation actually decreased.  At this time feel pt's headache and concussion are most limiting factors and may benefit from additional concussion therapy.  At this time we are anticipating d/c next visit  unless MD feels otherwise.  Will plan to issue and teach how to use home TENS unit.  Discussed renewal v/s d/c and at this time anticipate d/c due to limited functional progress.   Rehab Potential Good   PT Treatment/Interventions ADLs/Self Care Home Management;Cryotherapy;Electrical Stimulation;Moist Heat;Traction;Ultrasound;Patient/family education;Neuromuscular re-education;Therapeutic exercise;Therapeutic activities;Functional mobility training;Manual techniques;Passive range of motion;Dry needling;Taping   PT Next Visit Plan issue home TENS unit; plan for d/c   Consulted and Agree with Plan of Care Patient      Patient will benefit from skilled therapeutic intervention in order to improve the following deficits and impairments:  Postural dysfunction, Pain, Hypomobility, Decreased strength  Visit Diagnosis: Cervicalgia  Abnormal posture     Problem List Patient Active Problem List   Diagnosis Date Noted  . Neck pain 04/11/2016  . Concussion without loss of consciousness 11/25/2015  . Injury of left shoulder 11/10/2015  . Anxiety 09/29/2014  . Cardiac murmur 09/29/2014       Clarita Crane, PT, DPT 07/18/16 4:16 PM    [facility] [address]  [address] [place], [place], [address] Phone: [phone]   Fax:  [phone]  Name: Martha [place] MRN: [id number] Date of Birth: 1991/07/26

## 2016-07-19 ENCOUNTER — Ambulatory Visit (INDEPENDENT_AMBULATORY_CARE_PROVIDER_SITE_OTHER): Payer: BLUE CROSS/BLUE SHIELD | Admitting: Family Medicine

## 2016-07-19 ENCOUNTER — Encounter: Payer: Self-pay | Admitting: Family Medicine

## 2016-07-19 DIAGNOSIS — S060X0D Concussion without loss of consciousness, subsequent encounter: Secondary | ICD-10-CM | POA: Diagnosis not present

## 2016-07-19 DIAGNOSIS — M542 Cervicalgia: Secondary | ICD-10-CM

## 2016-07-19 NOTE — Patient Instructions (Signed)
Ok to continue the nortriptyline, propranolol as you have been. At this point I think we should go ahead with neurology referral both for the postconcussion syndrome and the weakness you are getting in your left arm. You will likely undergo further testing which may include nerve conduction studies, neuropsychological testing for concussion. Other options we discussed were going [facility] with [facility], referral specifically to neuropsychologist.
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
## 2016-07-20 NOTE — Assessment & Plan Note (Signed)
Long discussion today regarding her lack of overall progress over past 9 months, everything we have tried to date.  Currently on high dose nortriptyline and inderal and still not improving.  Last SCAT we performed had a high symptom score and severity score.  She continues seeing psychology.  Concern about concurrent non-concussion related pathology (sleep disorder, ? Depression).  We discussed a few options here: neurology referral for their recommendations, transfer to integrative therapies from our PT department for other treatment measures for postconcussion syndrome, referral to neuropsych for additional testing.  Will go ahead with neurology referral next.    Total visit time 25 minutes - nearly all of which spent on counseling and answering questions.

## 2016-07-21 ENCOUNTER — Ambulatory Visit: Payer: BLUE CROSS/BLUE SHIELD | Admitting: Rehabilitative and Restorative Service Providers"

## 2016-07-21 ENCOUNTER — Encounter: Payer: Self-pay | Admitting: Rehabilitative and Restorative Service Providers"

## 2016-07-21 DIAGNOSIS — M542 Cervicalgia: Secondary | ICD-10-CM

## 2016-07-21 DIAGNOSIS — R293 Abnormal posture: Secondary | ICD-10-CM

## 2016-07-21 NOTE — Addendum Note (Signed)
Addended by: Kathi SimpersWISE, Robinette Esters F on: 07/21/2016 04:33 PM   Modules accepted: Orders

## 2016-07-21 NOTE — Therapy (Signed)
[facility] [address]  [place], [place], [address] Phone: [phone]   Fax:  [phone]  Physical Therapy Treatment  Patient Details  Name: Ozetta Flatley MRN: [id number] Date of Birth: 01-29-1991 Referring Provider: Dr. Karlton Lemon  Encounter Date: 07/21/2016      PT End of Session - 07/21/16 1453    Visit Number 11   Number of Visits 12   Date for PT Re-Evaluation 07/21/16   PT Start Time 1450   PT Stop Time 1512   PT Time Calculation (min) 22 min   Activity Tolerance Patient tolerated treatment well      Past Medical History:  Diagnosis Date  . Anxiety   . Concussion   . Depression   . GERD (gastroesophageal reflux disease)    As an infant  . Headache   . Murmur   . Ruptured disc, cervical     Past Surgical History:  Procedure Laterality Date  . TONSILLECTOMY    . WISDOM TOOTH EXTRACTION      There were no vitals filed for this visit.      Subjective Assessment - 07/21/16 1522    Subjective Symptom free today - no headache, no neck pain. Fels confident in discontinuing PT at this time. Would like to have the TENS unit for home and can probably order one soon.    Currently in Pain? No/denies                         [facility] Adult PT Treatment/Exercise - 07/21/16 0001      Self-Care   Self-Care --  instructed in application, use and care of TENS unit                     PT Long Term Goals - 07/18/16 1601      PT LONG TERM GOAL #1   Title independent with HEP (07/21/16)   Status On-going     PT LONG TERM GOAL #2   Title perform c-spine AROM without pain for improved function (07/21/16)   Baseline pain continued with all motions; and ROM decreased due to headache   Status On-going     PT LONG TERM GOAL #3   Title report abilty to stand and walk > 30 min without increase in pain for improved function and anticipate return to work (07/21/16)   Baseline reports only able to tolerate 10-15 min (essentially unchanged from eval)   Status On-going     PT LONG TERM GOAL #4   Title verbalize understanding of posture/body mechanics to decrease risk of reinjury (07/21/16)   Status On-going               Plan - 07/21/16 1526    Clinical Impression Statement Patient presented today reporting that she was pain free. She is comfortable with discontinuing therapy at this time and will continue with her HEP. She wants try the TENS unit and is sure she will be able to use the TENS unit after today's instruction. Patient will be discharged from therapy today.    Rehab Potential Good   PT Frequency 2x / week   PT Duration 6 weeks   PT Treatment/Interventions ADLs/Self Care Home Management;Cryotherapy;Electrical Stimulation;Moist Heat;Traction;Ultrasound;Patient/family education;Neuromuscular re-education;Therapeutic exercise;Therapeutic activities;Functional mobility training;Manual techniques;Passive range of motion;Dry needling;Taping   PT Next Visit Plan d/c to independent HEP patient stated that she will order a TENS unit.  Consulted and Agree with Plan of Care Patient      Patient will benefit from skilled therapeutic intervention in order to improve the following deficits and impairments:  Postural dysfunction, Pain, Hypomobility, Decreased strength  Visit Diagnosis: Cervicalgia  Abnormal posture     Problem List Patient Active Problem List   Diagnosis Date Noted  . Neck pain 04/11/2016  . Concussion without loss of consciousness 11/25/2015  . Injury of left shoulder 11/10/2015  . Anxiety 09/29/2014  . Cardiac murmur 09/29/2014    Ayn Domangue Nilda Simmer PT, MPH  07/21/2016, 3:32 PM  [facility] [address]  [place], [place], [address] Phone: [phone]   Fax:  [phone]  Name: Angely Dietz MRN: [id number] Date of Birth: Feb 26, 1991   PHYSICAL  THERAPY DISCHARGE SUMMARY  Visits from Start of Care: 11  Current functional level related to goals / functional outcomes: Some improvement in symptoms since beginning PT. See progress note 07/18/16 for discharge status. Patient will call with any questions or concerns.    Remaining deficits: Intermittent headaches and cervical tightness/pain   Education / Equipment: HEP Plan: Patient agrees to discharge.  Patient goals were partially met. Patient is being discharged due to being pleased with the current functional level.  ?????  Jovan Schickling P. Helene Kelp PT, MPH 07/21/16 3:34 PM

## 2016-07-26 ENCOUNTER — Telehealth: Payer: Self-pay | Admitting: Family Medicine

## 2016-07-29 ENCOUNTER — Ambulatory Visit (INDEPENDENT_AMBULATORY_CARE_PROVIDER_SITE_OTHER): Payer: BLUE CROSS/BLUE SHIELD | Admitting: Psychology

## 2016-07-29 DIAGNOSIS — F4323 Adjustment disorder with mixed anxiety and depressed mood: Secondary | ICD-10-CM

## 2016-08-02 ENCOUNTER — Telehealth: Payer: Self-pay | Admitting: Family Medicine

## 2016-08-03 NOTE — Telephone Encounter (Signed)
Patient contacted

## 2016-08-12 NOTE — Telephone Encounter (Signed)
Spoke to patient and told her that soon as I had information that I would contact her.

## 2016-08-22 ENCOUNTER — Ambulatory Visit (INDEPENDENT_AMBULATORY_CARE_PROVIDER_SITE_OTHER): Payer: BLUE CROSS/BLUE SHIELD | Admitting: Psychology

## 2016-08-22 DIAGNOSIS — F4323 Adjustment disorder with mixed anxiety and depressed mood: Secondary | ICD-10-CM | POA: Diagnosis not present

## 2016-08-23 ENCOUNTER — Telehealth: Payer: Self-pay | Admitting: Family Medicine

## 2016-08-23 NOTE — Telephone Encounter (Signed)
Spoke to patient and told her that may have a place that can see her earlier.

## 2016-09-01 ENCOUNTER — Telehealth: Payer: Self-pay | Admitting: Family Medicine

## 2016-09-02 NOTE — Telephone Encounter (Signed)
Work note printed and placed up front.  Martha Campos can give better idea re: neurologist.

## 2016-09-12 ENCOUNTER — Telehealth: Payer: Self-pay | Admitting: Family Medicine

## 2016-09-12 NOTE — Telephone Encounter (Signed)
Spoke to patient and told her that I would contact neuro office to see about appointment. Called patient back and gave her number to contact them and set up appointment.

## 2016-09-13 NOTE — Telephone Encounter (Signed)
See more recent phone note for update.

## 2016-09-16 ENCOUNTER — Ambulatory Visit (INDEPENDENT_AMBULATORY_CARE_PROVIDER_SITE_OTHER): Payer: BLUE CROSS/BLUE SHIELD | Admitting: Psychology

## 2016-09-16 DIAGNOSIS — F4323 Adjustment disorder with mixed anxiety and depressed mood: Secondary | ICD-10-CM

## 2016-09-19 ENCOUNTER — Other Ambulatory Visit: Payer: Self-pay | Admitting: Family Medicine

## 2016-09-22 ENCOUNTER — Other Ambulatory Visit: Payer: Self-pay | Admitting: *Deleted

## 2016-10-04 ENCOUNTER — Telehealth: Payer: Self-pay | Admitting: Family Medicine

## 2016-10-04 NOTE — Telephone Encounter (Signed)
Patient left voicemail asking for a letter for [employer] stating she is out of work and what the reason is.  Please let her know when ready, thank you.

## 2016-10-05 NOTE — Telephone Encounter (Signed)
Printed letter for her stating she remain out of work until follow-up with concussion specialist for further recommendations.  I included the case number also as she needed this last time.

## 2016-10-05 NOTE — Telephone Encounter (Signed)
What's the status on her appointments?  It looked like she had started seeing neurology for this and they referred her to a concussion specialist.  Typically at this point I would then defer any further discussion regarding work status to them.

## 2016-10-05 NOTE — Telephone Encounter (Signed)
Spoke to patient and gave her information provided by physician regarding letter about work status. Patient stated she is waiting on appointment with concussion specialist.

## 2016-10-06 NOTE — Telephone Encounter (Signed)
Letter placed up front  

## 2016-10-10 ENCOUNTER — Ambulatory Visit (INDEPENDENT_AMBULATORY_CARE_PROVIDER_SITE_OTHER): Payer: BLUE CROSS/BLUE SHIELD | Admitting: Psychology

## 2016-10-10 ENCOUNTER — Encounter: Payer: Self-pay | Admitting: Family Medicine

## 2016-10-10 DIAGNOSIS — F4323 Adjustment disorder with mixed anxiety and depressed mood: Secondary | ICD-10-CM

## 2016-11-07 ENCOUNTER — Ambulatory Visit (INDEPENDENT_AMBULATORY_CARE_PROVIDER_SITE_OTHER): Payer: BLUE CROSS/BLUE SHIELD | Admitting: Psychology

## 2016-11-07 ENCOUNTER — Telehealth: Payer: Self-pay | Admitting: Family Medicine

## 2016-11-07 DIAGNOSIS — F4323 Adjustment disorder with mixed anxiety and depressed mood: Secondary | ICD-10-CM | POA: Diagnosis not present
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
## 2016-11-07 NOTE — Telephone Encounter (Signed)
Neurology and PM&R physicians should be the ones handling this.  Our last letter reflected this as she was referred to them for further recommendations and treatment.

## 2016-11-07 NOTE — Telephone Encounter (Signed)
Spoke to patient and told her that Neurology and PM&R physicians should be the ones handling this. Patient stated that Neurology and PM&R physicians stated they could not write the letters since they were not the ones that took the patient out of work.

## 2016-11-07 NOTE — Telephone Encounter (Signed)
Spoke to patient and gave her information provided by the physician. 

## 2016-11-07 NOTE — Telephone Encounter (Signed)
It doesn't matter who initially took her out of work.  I wrote for her to be out of work until she was evaluated by the neurologist/concussion specialist.  They are seeing her, prescribing therapy and medications, and reevaluating her status.  It is up to them whether or not she is out of work or if she needs light duty.
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
## 2016-12-23 ENCOUNTER — Ambulatory Visit (INDEPENDENT_AMBULATORY_CARE_PROVIDER_SITE_OTHER): Payer: BLUE CROSS/BLUE SHIELD | Admitting: Psychology

## 2016-12-23 DIAGNOSIS — F4323 Adjustment disorder with mixed anxiety and depressed mood: Secondary | ICD-10-CM | POA: Diagnosis not present

## 2017-01-13 ENCOUNTER — Ambulatory Visit (INDEPENDENT_AMBULATORY_CARE_PROVIDER_SITE_OTHER): Payer: BLUE CROSS/BLUE SHIELD | Admitting: Psychology

## 2017-01-13 DIAGNOSIS — F4323 Adjustment disorder with mixed anxiety and depressed mood: Secondary | ICD-10-CM

## 2017-02-17 ENCOUNTER — Ambulatory Visit (INDEPENDENT_AMBULATORY_CARE_PROVIDER_SITE_OTHER): Payer: BLUE CROSS/BLUE SHIELD | Admitting: Psychology

## 2017-02-17 DIAGNOSIS — F4323 Adjustment disorder with mixed anxiety and depressed mood: Secondary | ICD-10-CM
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
## 2017-03-22 ENCOUNTER — Ambulatory Visit (INDEPENDENT_AMBULATORY_CARE_PROVIDER_SITE_OTHER): Payer: BLUE CROSS/BLUE SHIELD | Admitting: Family Medicine

## 2017-03-22 VITALS — BP 104/72 | HR 99 | Temp 97.8°F | Ht 61.0 in | Wt 156.4 lb

## 2017-03-22 DIAGNOSIS — Z13220 Encounter for screening for lipoid disorders: Secondary | ICD-10-CM | POA: Diagnosis not present

## 2017-03-22 DIAGNOSIS — Z1329 Encounter for screening for other suspected endocrine disorder: Secondary | ICD-10-CM

## 2017-03-22 DIAGNOSIS — Z130 Encounter for screening for diseases of the blood and blood-forming organs and certain disorders involving the immune mechanism: Secondary | ICD-10-CM

## 2017-03-22 DIAGNOSIS — R45851 Suicidal ideations: Secondary | ICD-10-CM | POA: Diagnosis not present

## 2017-03-22 DIAGNOSIS — G4710 Hypersomnia, unspecified: Secondary | ICD-10-CM

## 2017-03-22 DIAGNOSIS — Z0000 Encounter for general adult medical examination without abnormal findings: Secondary | ICD-10-CM

## 2017-03-22 DIAGNOSIS — L700 Acne vulgaris: Secondary | ICD-10-CM

## 2017-03-22 DIAGNOSIS — Z131 Encounter for screening for diabetes mellitus: Secondary | ICD-10-CM | POA: Diagnosis not present

## 2017-03-22 LAB — COMPREHENSIVE METABOLIC PANEL
ALT: 18 U/L (ref 0–35)
AST: 19 U/L (ref 0–37)
Albumin: 4.3 g/dL (ref 3.5–5.2)
Alkaline Phosphatase: 108 U/L (ref 39–117)
BUN: 13 mg/dL (ref 6–23)
CHLORIDE: 105 meq/L (ref 96–112)
CO2: 25 meq/L (ref 19–32)
CREATININE: 0.77 mg/dL (ref 0.40–1.20)
Calcium: 9.4 mg/dL (ref 8.4–10.5)
GFR: 118.2 mL/min (ref >60.00)
Glucose, Bld: 87 mg/dL (ref 70–99)
Potassium: 3.8 mEq/L (ref 3.5–5.1)
SODIUM: 138 meq/L (ref 135–145)
Total Bilirubin: 0.2 mg/dL (ref 0.2–1.2)
Total Protein: 7.4 g/dL (ref 6.0–8.3)

## 2017-03-22 LAB — CBC
HCT: 40.9 % (ref 36.0–46.0)
Hemoglobin: 13.3 g/dL (ref 12.0–15.0)
MCHC: 32.7 g/dL (ref 30.0–36.0)
MCV: 83 fl (ref 78.0–100.0)
Platelets: 338 10*3/uL (ref 150.0–400.0)
RBC: 4.92 Mil/uL (ref 3.87–5.11)
RDW: 14.5 % (ref 11.5–15.5)
WBC: 8.1 10*3/uL (ref 4.0–10.5)

## 2017-03-22 LAB — LIPID PANEL
CHOLESTEROL: 153 mg/dL (ref 0–200)
HDL: 36.2 mg/dL — ABNORMAL LOW (ref >39.00)
LDL Cholesterol: 96 mg/dL (ref 0–99)
NONHDL: 117.08
Total CHOL/HDL Ratio: 4
Triglycerides: 107 mg/dL (ref 0.0–149.0)
VLDL: 21.4 mg/dL (ref 0.0–40.0)

## 2017-03-22 LAB — TSH: TSH: 1.75 u[IU]/mL (ref 0.35–4.50)

## 2017-03-22 LAB — HEMOGLOBIN A1C: HEMOGLOBIN A1C: 5.5 % (ref 4.6–6.5)

## 2017-03-22 MED ORDER — TRETINOIN 0.025 % EX CREA: TOPICAL_CREAM | Freq: Every day | CUTANEOUS | 0 refills | Status: DC

## 2017-03-22 NOTE — Patient Instructions (Addendum)
Let's get you to see a psychiatrist asap- please call the provider of your choice asap.  If you are ever not safe or at risk of self harm please call 911 or go to the nearest ER right away   [facility] [address] [place], [place] [address]  Phone number [phone]  [facility]  [address] [place], [place] [address]  Phone number [phone]    Dorina Hoyer MD  [address] [place], [place] [address]  Phone number [phone]   Tacey Heap MD  [address] [place], [place] [address]  Phone number [phone]   Vonda Antigua MD  [address] [place], [place] [address]  Phone number [phone]   We will get labs for you today Try the retinoid cream as needed for acne- if using it daily is too hard on your skin decrease to every 2-3 days  Please start making a daily schedule for yourself and be more active.  Exercise, housework/ cooking and online classes may all be good options for you.    Please either see me in 1-2 weeks, or if this is too difficult for you please contact me via mychart with an update!

## 2017-03-24 ENCOUNTER — Ambulatory Visit (INDEPENDENT_AMBULATORY_CARE_PROVIDER_SITE_OTHER): Payer: BLUE CROSS/BLUE SHIELD | Admitting: Psychology

## 2017-03-24 DIAGNOSIS — F4323 Adjustment disorder with mixed anxiety and depressed mood: Secondary | ICD-10-CM

## 2017-03-28 NOTE — Progress Notes (Signed)
[facility] at [facility] [address], [address] [place], [place] [address] [phone]  Date:  03/29/2017   Name:  Martha Campos   DOB:  Nov 24, 1991   MRN:  [id number]  PCP:  Abbe Amsterdam, MD    Chief Complaint: Follow-up   History of Present Illness:  Martha Campos is a 26 y.o. very pleasant female patient who presents with the following:  Last seen by myself a week ago for a CPE; however at that time she also admitted to recent (not active) suicidal thoughts. We arranged for close follow-up today.  She first states that she has not had any suicidal thoughts since our last visit- this is great! She is seeing Aurther Loft for counseling and feels like this is helping her.  She has made an effort to be more active- she is taking walks and avoiding staying in bed all day She is looking at the finances of going back to school and also plans to start job hunting soon. Overall she is doing much better  She has an IUD in place.  Has been in a relationship with the same female partner for 6 years. However she notes that more than once her GYN has dx her with chlamydia.  She has been treated but then turned up positive again. She wonders what this might mean. Explained to her that this is a STI- so if she keeps getting chlamydia it likely means that her partner has either been chronically infected and not treated or unfaithful.  She expressed understanding and states that she has been thinking of getting out of this relationship anway  Pulse Readings from Last 3 Encounters:  03/29/17 98  03/22/17 99  07/19/16 88    Sh  Patient Active Problem List   Diagnosis Date Noted  . Neck pain 04/11/2016  . Concussion without loss of consciousness 11/25/2015  . Injury of left shoulder 11/10/2015  . Anxiety 09/29/2014  . Cardiac murmur 09/29/2014    Past Medical History:  Diagnosis Date  . Anxiety   . Concussion   . Depression   . GERD  (gastroesophageal reflux disease)    As an infant  . Headache   . Murmur   . Ruptured disc, cervical     Past Surgical History:  Procedure Laterality Date  . TONSILLECTOMY    . WISDOM TOOTH EXTRACTION      Social History  Substance Use Topics  . Smoking status: Never Smoker  . Smokeless tobacco: Never Used  . Alcohol use No    Family History  Problem Relation Age of Onset  . Heart disease Father   . Hypertension Neg Hx   . Depression Neg Hx   . Cancer Neg Hx     Breast Cancer  . Anxiety disorder Neg Hx   . Breast cancer Maternal Grandmother   . Leukemia Maternal Grandmother   . Diabetes Maternal Grandfather   . COPD Maternal Grandfather   . Diabetes Paternal Grandmother     Allergies  Allergen Reactions  . Sulfa Antibiotics Rash    Medication list has been reviewed and updated.  Current Outpatient Prescriptions on File Prior to Visit  Medication Sig Dispense Refill  . albuterol (PROVENTIL HFA;VENTOLIN HFA) 108 (90 Base) MCG/ACT inhaler Inhale 2 puffs into the lungs every 6 (six) hours as needed for wheezing or shortness of breath. Use prior to exercise 1 Inhaler 3  . Levonorgestrel 13.5 MG IUD by Intrauterine route.    Marland Kitchen  nortriptyline (PAMELOR) 75 MG capsule TAKE ONE CAPSULE BY MOUTH AT BEDTIME 30 capsule 2  . sertraline (ZOLOFT) 100 MG tablet     . tretinoin (RETIN-A) 0.025 % cream Apply topically at bedtime. 45 g 0   No current facility-administered medications on file prior to visit.     Review of Systems:  As per HPI- otherwise negative.   Physical Examination: Vitals:   03/29/17 1606  BP: 116/76  Pulse: 98  Temp: 98.4 F (36.9 C)   Vitals:   03/29/17 1606  Weight: 157 lb 9.6 oz (71.5 kg)  Height:  (1.549 m)   Body mass index is 29.78 kg/m. Ideal Body Weight: Weight in (lb) to have BMI = 25: 132   GEN: WDWN, NAD, Non-toxic, Alert & Oriented x 3 HEENT: Atraumatic, Normocephalic.  Ears and Nose: No external deformity. EXTR: No  clubbing/cyanosis/edema NEURO: Normal gait.  PSYCH: Normally interactive. Conversant. Not depressed or anxious appearing.  Calm demeanor.  Looks well, mild overweight  Assessment and Plan: Depression, recurrent (HCC)  Here today to recheck on her depression.  Happily, she is doing well!  Encouraged her to continue the positive changes she has made.  She plans to see me in one month and will continue her therpay  Signed Abbe Amsterdam, MD

## 2017-03-29 ENCOUNTER — Ambulatory Visit (INDEPENDENT_AMBULATORY_CARE_PROVIDER_SITE_OTHER): Payer: BLUE CROSS/BLUE SHIELD | Admitting: Family Medicine

## 2017-03-29 ENCOUNTER — Encounter: Payer: Self-pay | Admitting: Family Medicine

## 2017-03-29 VITALS — BP 116/76 | HR 98 | Temp 98.4°F | Ht 61.0 in | Wt 157.6 lb

## 2017-03-29 DIAGNOSIS — F339 Major depressive disorder, recurrent, unspecified: Secondary | ICD-10-CM

## 2017-03-29 NOTE — Progress Notes (Signed)
Pre visit review using our clinic tool,if applicable. No additional management support is needed unless otherwise documented below in the visit note.  

## 2017-03-29 NOTE — Patient Instructions (Signed)
It was good to see you today!  I am proud of the effort you have made.  Continue to be more active and to make a plan for your future (school? Job?)   Please see me in about one month - contact me sooner if you are not doing ok If you are not able to get in to see me you can also send me a mychart message!

## 2017-04-27 ENCOUNTER — Ambulatory Visit (INDEPENDENT_AMBULATORY_CARE_PROVIDER_SITE_OTHER): Payer: BLUE CROSS/BLUE SHIELD | Admitting: Family Medicine

## 2017-04-27 VITALS — BP 110/72 | HR 96 | Temp 98.1°F | Ht 61.0 in | Wt 163.2 lb

## 2017-04-27 DIAGNOSIS — F339 Major depressive disorder, recurrent, unspecified: Secondary | ICD-10-CM | POA: Diagnosis not present

## 2017-04-27 DIAGNOSIS — F0781 Postconcussional syndrome: Secondary | ICD-10-CM | POA: Diagnosis not present

## 2017-04-27 DIAGNOSIS — G4710 Hypersomnia, unspecified: Secondary | ICD-10-CM

## 2017-04-27 MED ORDER — NORTRIPTYLINE HCL 50 MG PO CAPS: 50.0000 mg | ORAL_CAPSULE | Freq: Every day | ORAL | 3 refills | Status: DC

## 2017-04-27 MED ORDER — SERTRALINE HCL 100 MG PO TABS: 150.0000 mg | ORAL_TABLET | Freq: Every day | ORAL | 6 refills | Status: DC

## 2017-04-27 NOTE — Patient Instructions (Addendum)
It was nice to see you today- I am sorry that you are having a hard time!  Let's try going up on your sertraline- increase to 1 1/2 tablets or 150 mg.  Let's also try decreasing the nortriptyline to 50 mg as this may be making you feel sleepy  Please do think about getting a 2nd part time job where you will be around people your age!  

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
## 2017-05-05 ENCOUNTER — Ambulatory Visit: Payer: BLUE CROSS/BLUE SHIELD | Admitting: Psychology
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
## 2017-06-01 ENCOUNTER — Ambulatory Visit (INDEPENDENT_AMBULATORY_CARE_PROVIDER_SITE_OTHER): Payer: BLUE CROSS/BLUE SHIELD | Admitting: Family Medicine

## 2017-06-01 DIAGNOSIS — F0781 Postconcussional syndrome: Secondary | ICD-10-CM | POA: Diagnosis not present

## 2017-06-01 DIAGNOSIS — G4710 Hypersomnia, unspecified: Secondary | ICD-10-CM | POA: Diagnosis not present

## 2017-06-01 DIAGNOSIS — F339 Major depressive disorder, recurrent, unspecified: Secondary | ICD-10-CM | POA: Diagnosis not present

## 2017-06-01 MED ORDER — NORTRIPTYLINE HCL 25 MG PO CAPS: 25.0000 mg | ORAL_CAPSULE | Freq: Every day | ORAL | 3 refills | Status: DC

## 2017-06-01 NOTE — Patient Instructions (Signed)
I am so glad to see you doing better today!  Keep up the excellent work Please see me in 2-3 months for a recheck- sooner if you need anything at all

## 2017-06-09 ENCOUNTER — Ambulatory Visit (INDEPENDENT_AMBULATORY_CARE_PROVIDER_SITE_OTHER): Payer: BLUE CROSS/BLUE SHIELD | Admitting: Psychology

## 2017-06-09 DIAGNOSIS — F4323 Adjustment disorder with mixed anxiety and depressed mood: Secondary | ICD-10-CM | POA: Diagnosis not present

## 2017-06-20 ENCOUNTER — Encounter: Payer: Self-pay | Admitting: Family Medicine

## 2017-07-13 ENCOUNTER — Ambulatory Visit (INDEPENDENT_AMBULATORY_CARE_PROVIDER_SITE_OTHER): Payer: BLUE CROSS/BLUE SHIELD | Admitting: Family Medicine

## 2017-07-13 VITALS — BP 114/65 | HR 94 | Temp 98.7°F | Ht 61.0 in | Wt 155.4 lb

## 2017-07-13 DIAGNOSIS — F0781 Postconcussional syndrome: Secondary | ICD-10-CM | POA: Diagnosis not present

## 2017-07-13 DIAGNOSIS — G4710 Hypersomnia, unspecified: Secondary | ICD-10-CM | POA: Diagnosis not present

## 2017-07-13 DIAGNOSIS — F339 Major depressive disorder, recurrent, unspecified: Secondary | ICD-10-CM

## 2017-07-13 MED ORDER — TOPIRAMATE 25 MG PO TABS: 50.0000 mg | ORAL_TABLET | Freq: Every day | ORAL | 5 refills | Status: DC
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
## 2017-07-13 NOTE — Patient Instructions (Addendum)
It was good to see you today- you can stop the nortriptyline at this time Let me know if you need anything else as far as your letter

## 2017-07-14 ENCOUNTER — Ambulatory Visit (INDEPENDENT_AMBULATORY_CARE_PROVIDER_SITE_OTHER): Payer: BLUE CROSS/BLUE SHIELD | Admitting: Psychology

## 2017-07-14 DIAGNOSIS — F4323 Adjustment disorder with mixed anxiety and depressed mood: Secondary | ICD-10-CM

## 2017-08-17 ENCOUNTER — Ambulatory Visit: Payer: Self-pay | Admitting: Family Medicine

## 2017-09-15 ENCOUNTER — Ambulatory Visit (INDEPENDENT_AMBULATORY_CARE_PROVIDER_SITE_OTHER): Payer: BLUE CROSS/BLUE SHIELD | Admitting: Psychology

## 2017-09-15 DIAGNOSIS — F4323 Adjustment disorder with mixed anxiety and depressed mood: Secondary | ICD-10-CM

## 2017-09-29 ENCOUNTER — Telehealth: Payer: Self-pay | Admitting: *Deleted

## 2017-09-29 NOTE — Telephone Encounter (Signed)
[facility]; forwarded to [place] for email/scana/SLS 10/19

## 2017-10-27 ENCOUNTER — Ambulatory Visit (INDEPENDENT_AMBULATORY_CARE_PROVIDER_SITE_OTHER): Payer: BLUE CROSS/BLUE SHIELD | Admitting: Psychology

## 2017-10-27 DIAGNOSIS — F4323 Adjustment disorder with mixed anxiety and depressed mood: Secondary | ICD-10-CM

## 2017-12-22 ENCOUNTER — Ambulatory Visit (INDEPENDENT_AMBULATORY_CARE_PROVIDER_SITE_OTHER): Payer: BLUE CROSS/BLUE SHIELD | Admitting: Psychology

## 2017-12-22 DIAGNOSIS — F4323 Adjustment disorder with mixed anxiety and depressed mood: Secondary | ICD-10-CM

## 2017-12-27 ENCOUNTER — Other Ambulatory Visit: Payer: Self-pay | Admitting: Family Medicine

## 2017-12-27 DIAGNOSIS — G4710 Hypersomnia, unspecified: Secondary | ICD-10-CM

## 2017-12-27 DIAGNOSIS — F339 Major depressive disorder, recurrent, unspecified: Secondary | ICD-10-CM

## 2017-12-27 DIAGNOSIS — F0781 Postconcussional syndrome: Secondary | ICD-10-CM

## 2018-02-02 ENCOUNTER — Ambulatory Visit (INDEPENDENT_AMBULATORY_CARE_PROVIDER_SITE_OTHER): Payer: BLUE CROSS/BLUE SHIELD | Admitting: Psychology

## 2018-02-02 DIAGNOSIS — F4323 Adjustment disorder with mixed anxiety and depressed mood: Secondary | ICD-10-CM | POA: Diagnosis not present
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
## 2018-05-09 ENCOUNTER — Ambulatory Visit (INDEPENDENT_AMBULATORY_CARE_PROVIDER_SITE_OTHER): Payer: BLUE CROSS/BLUE SHIELD | Admitting: Family Medicine

## 2018-05-09 ENCOUNTER — Encounter: Payer: Self-pay | Admitting: Family Medicine

## 2018-05-09 VITALS — BP 102/76 | HR 97 | Resp 16 | Ht 61.0 in | Wt 131.0 lb

## 2018-05-09 DIAGNOSIS — G4710 Hypersomnia, unspecified: Secondary | ICD-10-CM | POA: Diagnosis not present

## 2018-05-09 DIAGNOSIS — Z0000 Encounter for general adult medical examination without abnormal findings: Secondary | ICD-10-CM | POA: Diagnosis not present

## 2018-05-09 DIAGNOSIS — F339 Major depressive disorder, recurrent, unspecified: Secondary | ICD-10-CM | POA: Diagnosis not present

## 2018-05-09 DIAGNOSIS — F0781 Postconcussional syndrome: Secondary | ICD-10-CM | POA: Diagnosis not present

## 2018-05-09 MED ORDER — TOPIRAMATE 50 MG PO TABS: 50.0000 mg | ORAL_TABLET | Freq: Every day | ORAL | 3 refills | Status: DC

## 2018-05-09 MED ORDER — SERTRALINE HCL 100 MG PO TABS: 200.0000 mg | ORAL_TABLET | Freq: Every day | ORAL | 3 refills | Status: DC

## 2018-05-09 NOTE — Patient Instructions (Signed)
Great to see you today- I am so glad to see you doing well!   We can plan to do labs for you next year I refilled your topamax We will increase your sertraline to 200 mg a day; please let me know how this works for you Please see me in 6 months to check in- sooner if any concerns   Health Maintenance, Female Adopting a healthy lifestyle and getting preventive care can go a long way to promote health and wellness. Talk with your health care provider about what schedule of regular examinations is right for you. This is a good chance for you to check in with your provider about disease prevention and staying healthy. In between checkups, there are plenty of things you can do on your own. Experts have done a lot of research about which lifestyle changes and preventive measures are most likely to keep you healthy. Ask your health care provider for more information. Weight and diet Eat a healthy diet  Be sure to include plenty of vegetables, fruits, low-fat dairy products, and lean protein.  Do not eat a lot of foods high in solid fats, added sugars, or salt.  Get regular exercise. This is one of the most important things you can do for your health. ? Most adults should exercise for at least 150 minutes each week. The exercise should increase your heart rate and make you sweat (moderate-intensity exercise). ? Most adults should also do strengthening exercises at least twice a week. This is in addition to the moderate-intensity exercise.  Maintain a healthy weight  Body mass index (BMI) is a measurement that can be used to identify possible weight problems. It estimates body fat based on height and weight. Your health care provider can help determine your BMI and help you achieve or maintain a healthy weight.  For females 71 years of age and older: ? A BMI below 18.5 is considered underweight. ? A BMI of 18.5 to 24.9 is normal. ? A BMI of 25 to 29.9 is considered overweight. ? A BMI of 30 and  above is considered obese.  Watch levels of cholesterol and blood lipids  You should start having your blood tested for lipids and cholesterol at 25 years of age, then have this test every 5 years.  You may need to have your cholesterol levels checked more often if: ? Your lipid or cholesterol levels are high. ? You are older than 25 years of age. ? You are at high risk for heart disease.  Cancer screening Lung Cancer  Lung cancer screening is recommended for adults 29-36 years old who are at high risk for lung cancer because of a history of smoking.  A yearly low-dose CT scan of the lungs is recommended for people who: ? Currently smoke. ? Have quit within the past 15 years. ? Have at least a 30-pack-year history of smoking. A pack year is smoking an average of one pack of cigarettes a day for 1 year.  Yearly screening should continue until it has been 15 years since you quit.  Yearly screening should stop if you develop a health problem that would prevent you from having lung cancer treatment.  Breast Cancer  Practice breast self-awareness. This means understanding how your breasts normally appear and feel.  It also means doing regular breast self-exams. Let your health care provider know about any changes, no matter how small.  If you are in your 20s or 30s, you should have a clinical breast  exam (CBE) by a health care provider every 1-3 years as part of a regular health exam.  If you are 40 or older, have a CBE every year. Also consider having a breast X-ray (mammogram) every year.  If you have a family history of breast cancer, talk to your health care provider about genetic screening.  If you are at high risk for breast cancer, talk to your health care provider about having an MRI and a mammogram every year.  Breast cancer gene (BRCA) assessment is recommended for women who have family members with BRCA-related cancers. BRCA-related cancers  include: ? Breast. ? Ovarian. ? Tubal. ? Peritoneal cancers.  Results of the assessment will determine the need for genetic counseling and BRCA1 and BRCA2 testing.  Cervical Cancer Your health care provider may recommend that you be screened regularly for cancer of the pelvic organs (ovaries, uterus, and vagina). This screening involves a pelvic examination, including checking for microscopic changes to the surface of your cervix (Pap test). You may be encouraged to have this screening done every 3 years, beginning at age 47.  For women ages 63-65, health care providers may recommend pelvic exams and Pap testing every 3 years, or they may recommend the Pap and pelvic exam, combined with testing for human papilloma virus (HPV), every 5 years. Some types of HPV increase your risk of cervical cancer. Testing for HPV may also be done on women of any age with unclear Pap test results.  Other health care providers may not recommend any screening for nonpregnant women who are considered low risk for pelvic cancer and who do not have symptoms. Ask your health care provider if a screening pelvic exam is right for you.  If you have had past treatment for cervical cancer or a condition that could lead to cancer, you need Pap tests and screening for cancer for at least 20 years after your treatment. If Pap tests have been discontinued, your risk factors (such as having a new sexual partner) need to be reassessed to determine if screening should resume. Some women have medical problems that increase the chance of getting cervical cancer. In these cases, your health care provider may recommend more frequent screening and Pap tests.  Colorectal Cancer  This type of cancer can be detected and often prevented.  Routine colorectal cancer screening usually begins at 25 years of age and continues through 25 years of age.  Your health care provider may recommend screening at an earlier age if you have risk factors  for colon cancer.  Your health care provider may also recommend using home test kits to check for hidden blood in the stool.  A small camera at the end of a tube can be used to examine your colon directly (sigmoidoscopy or colonoscopy). This is done to check for the earliest forms of colorectal cancer.  Routine screening usually begins at age 51.  Direct examination of the colon should be repeated every 5-10 years through 25 years of age. However, you may need to be screened more often if early forms of precancerous polyps or small growths are found.  Skin Cancer  Check your skin from head to toe regularly.  Tell your health care provider about any new moles or changes in moles, especially if there is a change in a mole's shape or color.  Also tell your health care provider if you have a mole that is larger than the size of a pencil eraser.  Always use sunscreen. Apply sunscreen  liberally and repeatedly throughout the day.  Protect yourself by wearing long sleeves, pants, a wide-brimmed hat, and sunglasses whenever you are outside.  Heart disease, diabetes, and high blood pressure  High blood pressure causes heart disease and increases the risk of stroke. High blood pressure is more likely to develop in: ? People who have blood pressure in the high end of the normal range (130-139/85-89 mm Hg). ? People who are overweight or obese. ? People who are African American.  If you are 32-98 years of age, have your blood pressure checked every 3-5 years. If you are 39 years of age or older, have your blood pressure checked every year. You should have your blood pressure measured twice-once when you are at a hospital or clinic, and once when you are not at a hospital or clinic. Record the average of the two measurements. To check your blood pressure when you are not at a hospital or clinic, you can use: ? An automated blood pressure machine at a pharmacy. ? A home blood pressure monitor.  If  you are between 37 years and 72 years old, ask your health care provider if you should take aspirin to prevent strokes.  Have regular diabetes screenings. This involves taking a blood sample to check your fasting blood sugar level. ? If you are at a normal weight and have a low risk for diabetes, have this test once every three years after 25 years of age. ? If you are overweight and have a high risk for diabetes, consider being tested at a younger age or more often. Preventing infection Hepatitis B  If you have a higher risk for hepatitis B, you should be screened for this virus. You are considered at high risk for hepatitis B if: ? You were born in a country where hepatitis B is common. Ask your health care provider which countries are considered high risk. ? Your parents were born in a high-risk country, and you have not been immunized against hepatitis B (hepatitis B vaccine). ? You have HIV or AIDS. ? You use needles to inject street drugs. ? You live with someone who has hepatitis B. ? You have had sex with someone who has hepatitis B. ? You get hemodialysis treatment. ? You take certain medicines for conditions, including cancer, organ transplantation, and autoimmune conditions.  Hepatitis C  Blood testing is recommended for: ? Everyone born from [birth year] through 1963. ? Anyone with known risk factors for hepatitis C.  Sexually transmitted infections (STIs)  You should be screened for sexually transmitted infections (STIs) including gonorrhea and chlamydia if: ? You are sexually active and are younger than 25 years of age. ? You are older than 25 years of age and your health care provider tells you that you are at risk for this type of infection. ? Your sexual activity has changed since you were last screened and you are at an increased risk for chlamydia or gonorrhea. Ask your health care provider if you are at risk.  If you do not have HIV, but are at risk, it may be recommended  that you take a prescription medicine daily to prevent HIV infection. This is called pre-exposure prophylaxis (PrEP). You are considered at risk if: ? You are sexually active and do not regularly use condoms or know the HIV status of your partner(s). ? You take drugs by injection. ? You are sexually active with a partner who has HIV.  Talk with your health care provider about  whether you are at high risk of being infected with HIV. If you choose to begin PrEP, you should first be tested for HIV. You should then be tested every 3 months for as long as you are taking PrEP. Pregnancy  If you are premenopausal and you may become pregnant, ask your health care provider about preconception counseling.  If you may become pregnant, take 400 to 800 micrograms (mcg) of folic acid every day.  If you want to prevent pregnancy, talk to your health care provider about birth control (contraception). Osteoporosis and menopause  Osteoporosis is a disease in which the bones lose minerals and strength with aging. This can result in serious bone fractures. Your risk for osteoporosis can be identified using a bone density scan.  If you are 43 years of age or older, or if you are at risk for osteoporosis and fractures, ask your health care provider if you should be screened.  Ask your health care provider whether you should take a calcium or vitamin D supplement to lower your risk for osteoporosis.  Menopause may have certain physical symptoms and risks.  Hormone replacement therapy may reduce some of these symptoms and risks. Talk to your health care provider about whether hormone replacement therapy is right for you. Follow these instructions at home:  Schedule regular health, dental, and eye exams.  Stay current with your immunizations.  Do not use any tobacco products including cigarettes, chewing tobacco, or electronic cigarettes.  If you are pregnant, do not drink alcohol.  If you are  breastfeeding, limit how much and how often you drink alcohol.  Limit alcohol intake to no more than 1 drink per day for nonpregnant women. One drink equals 12 ounces of beer, 5 ounces of wine, or 1 ounces of hard liquor.  Do not use street drugs.  Do not share needles.  Ask your health care provider for help if you need support or information about quitting drugs.  Tell your health care provider if you often feel depressed.  Tell your health care provider if you have ever been abused or do not feel safe at home. This information is not intended to replace advice given to you by your health care provider. Make sure you discuss any questions you have with your health care provider. Document Released: 06/13/2011 Document Revised: 05/05/2016 Document Reviewed: 09/01/2015 Elsevier Interactive Patient Education  [employer].

## 2018-05-25 ENCOUNTER — Ambulatory Visit (INDEPENDENT_AMBULATORY_CARE_PROVIDER_SITE_OTHER): Payer: BLUE CROSS/BLUE SHIELD | Admitting: Psychology

## 2018-05-25 DIAGNOSIS — F4323 Adjustment disorder with mixed anxiety and depressed mood: Secondary | ICD-10-CM | POA: Diagnosis not present

## 2018-07-24 ENCOUNTER — Other Ambulatory Visit: Payer: Self-pay

## 2018-07-24 ENCOUNTER — Encounter (HOSPITAL_BASED_OUTPATIENT_CLINIC_OR_DEPARTMENT_OTHER): Payer: Self-pay | Admitting: *Deleted

## 2018-07-24 ENCOUNTER — Emergency Department (HOSPITAL_BASED_OUTPATIENT_CLINIC_OR_DEPARTMENT_OTHER)
Admission: EM | Admit: 2018-07-24 | Discharge: 2018-07-25 | Disposition: A | Discharge status: Home / Self Care | Payer: BLUE CROSS/BLUE SHIELD | Attending: Emergency Medicine | Admitting: Emergency Medicine

## 2018-07-24 DIAGNOSIS — L03032 Cellulitis of left toe: Secondary | ICD-10-CM | POA: Insufficient documentation

## 2018-07-24 DIAGNOSIS — Z79899 Other long term (current) drug therapy: Secondary | ICD-10-CM | POA: Insufficient documentation

## 2018-07-24 DIAGNOSIS — R2242 Localized swelling, mass and lump, left lower limb: Secondary | ICD-10-CM | POA: Diagnosis present

## 2018-07-24 NOTE — ED Triage Notes (Signed)
She removed a tick from between her left 2nd and 3rd toes 2 days ago. Site is red, painful and swollen.

## 2018-07-25 MED ORDER — DOXYCYCLINE HYCLATE 100 MG PO CAPS: ORAL_CAPSULE | ORAL | 0 refills | Status: DC

## 2018-07-25 NOTE — ED Provider Notes (Signed)
[facility] EMERGENCY DEPARTMENT Provider Note   CSN: [id number] Arrival date & time: 07/24/18  2211     History   Chief Complaint Chief Complaint  Patient presents with  . Tick Removal    HPI Barb MerinoDominique Drumheller is a 27 y.o. female.  The history is provided by the patient.  Rash   This is a new problem. The current episode started yesterday. The problem has been gradually worsening. There has been no fever. The rash is present on the left toes. The pain is moderate. The pain has been constant since onset. Associated symptoms include itching and pain. She has tried nothing for the symptoms.   She reports she had to remove a tick from her left second toe 2 days ago.  Starting yesterday she started having redness and swelling to the toe. No fevers or vomiting, no headache or rash.  No other acute complaints.  Denies trauma Past Medical History:  Diagnosis Date  . Anxiety   . Concussion   . Depression   . GERD (gastroesophageal reflux disease)    As an infant  . Headache   . Murmur   . Ruptured disc, cervical     Patient Active Problem List   Diagnosis Date Noted  . Neck pain 04/11/2016  . Concussion without loss of consciousness 11/25/2015  . Injury of left shoulder 11/10/2015  . Anxiety 09/29/2014  . Cardiac murmur 09/29/2014    Past Surgical History:  Procedure Laterality Date  . TONSILLECTOMY    . WISDOM TOOTH EXTRACTION       OB History   None      Home Medications    Prior to Admission medications   Medication Sig Start Date End Date Taking? Authorizing Provider  sertraline (ZOLOFT) 100 MG tablet Take 2 tablets (200 mg total) by mouth daily. 05/09/18  Yes Copland, Gwenlyn FoundJessica C, MD  topiramate (TOPAMAX) 50 MG tablet Take 1 tablet (50 mg total) by mouth daily. 05/09/18  Yes Copland, Gwenlyn FoundJessica C, MD  albuterol (PROVENTIL HFA;VENTOLIN HFA) 108 (90 Base) MCG/ACT inhaler Inhale 2 puffs into the lungs every 6 (six) hours as needed for wheezing or shortness  of breath. Use prior to exercise 03/30/16   Copland, Gwenlyn FoundJessica C, MD  doxycycline (VIBRAMYCIN) 100 MG capsule One po bid x 7 days 07/25/18   Zadie RhineWickline, Meisha Salone, MD    Family History Family History  Problem Relation Age of Onset  . Heart disease Father   . Breast cancer Maternal Grandmother   . Leukemia Maternal Grandmother   . Diabetes Maternal Grandfather   . COPD Maternal Grandfather   . Diabetes Paternal Grandmother   . Hypertension Neg Hx   . Depression Neg Hx   . Cancer Neg Hx        Breast Cancer  . Anxiety disorder Neg Hx     Social History Social History   Tobacco Use  . Smoking status: Never Smoker  . Smokeless tobacco: Never Used  Substance Use Topics  . Alcohol use: No    Alcohol/week: 0.0 standard drinks  . Drug use: No     Allergies   Sulfa antibiotics   Review of Systems Review of Systems  Constitutional: Negative for fever.  Gastrointestinal: Negative for vomiting.  Skin: Positive for itching and rash.  Neurological: Negative for headaches.  All other systems reviewed and are negative.    Physical Exam Updated Vital Signs BP 104/68 (BP Location: Right Arm)   Pulse 84   Temp 98.2 F (36.8  C) (Oral)   Resp 16   Ht 1.549 m (5\' 1" )   Wt 55.3 kg   LMP 07/22/2018   SpO2 100%   BMI 23.05 kg/m   Physical Exam  CONSTITUTIONAL: Well developed/well nourished HEAD: Normocephalic/atraumatic EYES: EOMI ENMT: Mucous membranes moist NECK: supple no meningeal signs SPINE/BACK:entire spine nontender CV: S1/S2 noted, no murmurs/rubs/gallops noted LUNGS: Lungs are clear to auscultation bilaterally, no apparent distress ABDOMEN: soft NEURO: Pt is awake/alert/appropriate, moves all extremitiesx4.  No facial droop.   EXTREMITIES: pulses normal/equal, full ROM Tenderness, mild edema, erythema to left second toe.  There is no residual tick noted.  No crepitus.  There is mild streaking noted proximally on the foot.  See photo below SKIN: warm PSYCH: no  abnormalities of mood noted, alert and oriented to situation     Patient gave verbal permission to utilize photo for medical documentation only The image was not stored on any personal device ED Treatments / Results  Labs (all labs ordered are listed, but only abnormal results are displayed) Labs Reviewed - No data to display  EKG None  Radiology No results found.  Procedures Procedures (including critical care time)  Medications Ordered in ED Medications - No data to display   Initial Impression / Assessment and Plan / ED Course  I have reviewed the triage vital signs and the nursing notes.      Presents after tick removal with redness and swelling.  Suspicion for cellulitis.  Will place her on doxycycline.  Also advised to use Benadryl for itching and may improve her swelling.  We discussed strict return precautions  Final Clinical Impressions(s) / ED Diagnoses   Final diagnoses:  Cellulitis of toe of left foot    ED Discharge Orders         Ordered    doxycycline (VIBRAMYCIN) 100 MG capsule     07/25/18 0024           Zadie RhineWickline, Cyler Kappes, MD 07/25/18 0040

## 2018-08-24 ENCOUNTER — Encounter: Payer: Self-pay | Admitting: Family Medicine

## 2018-08-24 ENCOUNTER — Ambulatory Visit (INDEPENDENT_AMBULATORY_CARE_PROVIDER_SITE_OTHER): Payer: BLUE CROSS/BLUE SHIELD | Admitting: Family Medicine

## 2018-08-24 VITALS — BP 104/65 | HR 71 | Temp 98.2°F | Resp 16 | Ht 61.0 in | Wt 121.4 lb

## 2018-08-24 DIAGNOSIS — W57XXXD Bitten or stung by nonvenomous insect and other nonvenomous arthropods, subsequent encounter: Secondary | ICD-10-CM | POA: Diagnosis not present

## 2018-08-24 DIAGNOSIS — S90862D Insect bite (nonvenomous), left foot, subsequent encounter: Secondary | ICD-10-CM

## 2018-08-24 MED ORDER — CEFUROXIME AXETIL 500 MG PO TABS: 500.0000 mg | ORAL_TABLET | Freq: Two times a day (BID) | ORAL | 0 refills | Status: DC

## 2018-08-24 NOTE — Progress Notes (Signed)
Patient ID: Martha Campos, female    DOB: Mar 10, 1991  Age: 27 y.o. MRN: [id number]    Subjective:  Subjective  HPI Martha Campos presents for tick bite L foot 4 weeks ago.  She was started on doxy but stopped it because it made her sick to her stomach.    Review of Systems  Constitutional: Negative for activity change, appetite change, fatigue and unexpected weight change.  Respiratory: Negative for cough and shortness of breath.   Cardiovascular: Negative for chest pain and palpitations.  Skin:       Tick bite on low abd  Psychiatric/Behavioral: Negative for behavioral problems and dysphoric mood. The patient is not nervous/anxious.     History Past Medical History:  Diagnosis Date  . Anxiety   . Concussion   . Depression   . GERD (gastroesophageal reflux disease)    As an infant  . Headache   . Murmur   . Ruptured disc, cervical     She has a past surgical history that includes Tonsillectomy and Wisdom tooth extraction.   Her family history includes Breast cancer in her maternal grandmother; COPD in her maternal grandfather; Diabetes in her maternal grandfather and paternal grandmother; Heart disease in her father; Leukemia in her maternal grandmother.She reports that she has never smoked. She has never used smokeless tobacco. She reports that she does not drink alcohol or use drugs.  Current Outpatient Medications on File Prior to Visit  Medication Sig Dispense Refill  . albuterol (PROVENTIL HFA;VENTOLIN HFA) 108 (90 Base) MCG/ACT inhaler Inhale 2 puffs into the lungs every 6 (six) hours as needed for wheezing or shortness of breath. Use prior to exercise 1 Inhaler 3  . doxycycline (VIBRAMYCIN) 100 MG capsule One po bid x 7 days 14 capsule 0  . sertraline (ZOLOFT) 100 MG tablet Take 2 tablets (200 mg total) by mouth daily. 180 tablet 3  . topiramate (TOPAMAX) 50 MG tablet Take 1 tablet (50 mg total) by mouth daily. 90 tablet 3   No current facility-administered  medications on file prior to visit.      Objective:  Objective  Physical Exam  Constitutional: She is oriented to person, place, and time. She appears well-developed and well-nourished.  HENT:  Head: Normocephalic and atraumatic.  Eyes: Conjunctivae and EOM are normal.  Neck: Normal range of motion. Neck supple. No JVD present. Carotid bruit is not present. No thyromegaly present.  Cardiovascular: Normal rate, regular rhythm and normal heart sounds.  No murmur heard. Pulmonary/Chest: Effort normal and breath sounds normal. No respiratory distress. She has no wheezes. She has no rales. She exhibits no tenderness.  Musculoskeletal: She exhibits no edema.  Neurological: She is alert and oriented to person, place, and time.  Skin: There is erythema.     Psychiatric: She has a normal mood and affect.  Nursing note and vitals reviewed.           BP 104/65 (BP Location: Left Arm, Patient Position: Sitting, Cuff Size: Small)   Pulse 71   Temp 98.2 F (36.8 C) (Oral)   Resp 16   Ht 5\' 1"  (1.549 m)   Wt 121 lb 6.4 oz (55.1 kg)   LMP 08/19/2018   SpO2 98%   BMI 22.94 kg/m  Wt Readings from Last 3 Encounters:  08/24/18 121 lb 6.4 oz (55.1 kg)  07/24/18 122 lb (55.3 kg)  05/09/18 131 lb (59.4 kg)     Lab Results  Component Value Date   WBC 8.1 03/22/2017  HGB 13.3 03/22/2017   HCT 40.9 03/22/2017   PLT 338.0 03/22/2017   GLUCOSE 87 03/22/2017   CHOL 153 03/22/2017   TRIG 107.0 03/22/2017   HDL 36.20 (L) 03/22/2017   LDLCALC 96 03/22/2017   ALT 18 03/22/2017   AST 19 03/22/2017   NA 138 03/22/2017   K 3.8 03/22/2017   CL 105 03/22/2017   CREATININE 0.77 03/22/2017   BUN 13 03/22/2017   CO2 25 03/22/2017   TSH 1.75 03/22/2017   HGBA1C 5.5 03/22/2017    No results found.   Assessment & Plan:  Plan  I am having Martha Campos start on cefUROXime. I am also having her maintain her albuterol, sertraline, topiramate, and doxycycline.  Meds ordered this  encounter  Medications  . cefUROXime (CEFTIN) 500 MG tablet    Sig: Take 1 tablet (500 mg total) by mouth 2 (two) times daily with a meal.    Dispense:  20 tablet    Refill:  0    Problem List Items Addressed This Visit    None    Visit Diagnoses    Tick bite of left foot, subsequent encounter    -  Primary   Relevant Medications   cefUROXime (CEFTIN) 500 MG tablet   Other Relevant Orders   B. burgdorfi antibodies   Rocky mtn spotted fvr abs pnl(IgG+IgM)      Follow-up: Return if symptoms worsen or fail to improve.  Donato SchultzYvonne R Lowne Chase, DO

## 2018-08-24 NOTE — Patient Instructions (Signed)

## 2018-08-30 LAB — B. BURGDORFI ANTIBODIES: B burgdorferi Ab IgG+IgM: <0.9 index

## 2018-08-30 LAB — ROCKY MTN SPOTTED FVR ABS PNL(IGG+IGM)
RMSF IGG: DETECTED — AB
RMSF IGM: NOT DETECTED

## 2018-08-30 LAB — REFLEX RMSF IGG TITER: RMSF IgG Titer: 1:64 {titer} — ABNORMAL HIGH

## 2018-08-31 ENCOUNTER — Ambulatory Visit: Payer: Self-pay

## 2018-08-31 NOTE — Telephone Encounter (Signed)
See result note.  

## 2018-09-21 ENCOUNTER — Ambulatory Visit (INDEPENDENT_AMBULATORY_CARE_PROVIDER_SITE_OTHER): Payer: BLUE CROSS/BLUE SHIELD | Admitting: Psychology

## 2018-09-21 DIAGNOSIS — F4323 Adjustment disorder with mixed anxiety and depressed mood: Secondary | ICD-10-CM
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
## 2018-11-12 ENCOUNTER — Encounter: Payer: Self-pay | Admitting: Family Medicine

## 2018-11-12 ENCOUNTER — Ambulatory Visit (INDEPENDENT_AMBULATORY_CARE_PROVIDER_SITE_OTHER): Payer: Self-pay | Admitting: Family Medicine

## 2018-11-12 VITALS — BP 102/62 | HR 72 | Temp 98.5°F | Resp 16 | Ht 61.0 in | Wt 118.0 lb

## 2018-11-12 DIAGNOSIS — R6889 Other general symptoms and signs: Secondary | ICD-10-CM

## 2018-11-12 DIAGNOSIS — Z130 Encounter for screening for diseases of the blood and blood-forming organs and certain disorders involving the immune mechanism: Secondary | ICD-10-CM

## 2018-11-12 DIAGNOSIS — Z77120 Contact with and (suspected) exposure to mold (toxic): Secondary | ICD-10-CM

## 2018-11-12 DIAGNOSIS — J209 Acute bronchitis, unspecified: Secondary | ICD-10-CM

## 2018-11-12 DIAGNOSIS — Z131 Encounter for screening for diabetes mellitus: Secondary | ICD-10-CM

## 2018-11-12 LAB — CBC
HCT: 39.4 % (ref 36.0–46.0)
Hemoglobin: 13 g/dL (ref 12.0–15.0)
MCHC: 33 g/dL (ref 30.0–36.0)
MCV: 85.4 fl (ref 78.0–100.0)
Platelets: 288 10*3/uL (ref 150.0–400.0)
RBC: 4.62 Mil/uL (ref 3.87–5.11)
RDW: 13.8 % (ref 11.5–15.5)
WBC: 6.6 10*3/uL (ref 4.0–10.5)

## 2018-11-12 LAB — COMPREHENSIVE METABOLIC PANEL
ALBUMIN: 4.4 g/dL (ref 3.5–5.2)
ALK PHOS: 65 U/L (ref 39–117)
ALT: 8 U/L (ref 0–35)
AST: 13 U/L (ref 0–37)
BILIRUBIN TOTAL: 0.3 mg/dL (ref 0.2–1.2)
BUN: 12 mg/dL (ref 6–23)
CALCIUM: 9.3 mg/dL (ref 8.4–10.5)
CO2: 23 mEq/L (ref 19–32)
CREATININE: 0.7 mg/dL (ref 0.40–1.20)
Chloride: 108 mEq/L (ref 96–112)
GFR: 130.19 mL/min (ref >60.00)
Glucose, Bld: 89 mg/dL (ref 70–99)
Potassium: 4.1 mEq/L (ref 3.5–5.1)
SODIUM: 139 meq/L (ref 135–145)
Total Protein: 6.7 g/dL (ref 6.0–8.3)

## 2018-11-12 LAB — FERRITIN: Ferritin: 30.3 ng/mL (ref 10.0–291.0)

## 2018-11-12 LAB — TSH: TSH: 1.59 u[IU]/mL (ref 0.35–4.50)

## 2018-11-12 MED ORDER — AZITHROMYCIN 250 MG PO TABS: ORAL_TABLET | ORAL | 0 refills | Status: DC

## 2018-11-12 NOTE — Patient Instructions (Addendum)
I hope that you are able to resolve this mold issue, I am sorry that you are having to deal with this We are going to treat you for bronchitis with azithromycin I will be in touch with your labs asap  Please get a flu shot once you are feeling better You may also be due for a pap

## 2018-11-23 ENCOUNTER — Ambulatory Visit: Payer: BLUE CROSS/BLUE SHIELD | Admitting: Psychology

## 2019-08-08 ENCOUNTER — Other Ambulatory Visit: Payer: Self-pay | Admitting: Family Medicine

## 2019-08-08 DIAGNOSIS — F0781 Postconcussional syndrome: Secondary | ICD-10-CM

## 2019-08-08 DIAGNOSIS — F339 Major depressive disorder, recurrent, unspecified: Secondary | ICD-10-CM
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
## 2020-07-15 NOTE — Patient Instructions (Addendum)
Good to see you again today!  I will be in touch with your labs asap Please start back on topamax at 25 mg for a week, then you can increase to 50 mg We will also try fluoxetine 20 mg daily for depression.  Please let me know how this is working for you in the next few weeks- we can increase if needed  Health Maintenance, Female Adopting a healthy lifestyle and getting preventive care are important in promoting health and wellness. Ask your health care provider about:  The right schedule for you to have regular tests and exams.  Things you can do on your own to prevent diseases and keep yourself healthy. What should I know about diet, weight, and exercise? Eat a healthy diet   Eat a diet that includes plenty of vegetables, fruits, low-fat dairy products, and lean protein.  Do not eat a lot of foods that are high in solid fats, added sugars, or sodium. Maintain a healthy weight Body mass index (BMI) is used to identify weight problems. It estimates body fat based on height and weight. Your health care provider can help determine your BMI and help you achieve or maintain a healthy weight. Get regular exercise Get regular exercise. This is one of the most important things you can do for your health. Most adults should:  Exercise for at least 150 minutes each week. The exercise should increase your heart rate and make you sweat (moderate-intensity exercise).  Do strengthening exercises at least twice a week. This is in addition to the moderate-intensity exercise.  Spend less time sitting. Even light physical activity can be beneficial. Watch cholesterol and blood lipids Have your blood tested for lipids and cholesterol at 27 years of age, then have this test every 5 years. Have your cholesterol levels checked more often if:  Your lipid or cholesterol levels are high.  You are older than 28 years of age.  You are at high risk for heart disease. What should I know about cancer  screening? Depending on your health history and family history, you may need to have cancer screening at various ages. This may include screening for:  Breast cancer.  Cervical cancer.  Colorectal cancer.  Skin cancer.  Lung cancer. What should I know about heart disease, diabetes, and high blood pressure? Blood pressure and heart disease  High blood pressure causes heart disease and increases the risk of stroke. This is more likely to develop in people who have high blood pressure readings, are of African descent, or are overweight.  Have your blood pressure checked: ? Every 3-5 years if you are 31-15 years of age. ? Every year if you are 67 years old or older. Diabetes Have regular diabetes screenings. This checks your fasting blood sugar level. Have the screening done:  Once every three years after age 69 if you are at a normal weight and have a low risk for diabetes.  More often and at a younger age if you are overweight or have a high risk for diabetes. What should I know about preventing infection? Hepatitis B If you have a higher risk for hepatitis B, you should be screened for this virus. Talk with your health care provider to find out if you are at risk for hepatitis B infection. Hepatitis C Testing is recommended for:  Everyone born from [birth year] through 1963.  Anyone with known risk factors for hepatitis C. Sexually transmitted infections (STIs)  Get screened for STIs, including gonorrhea and chlamydia, if: ?  You are sexually active and are younger than 27 years of age. ? You are older than 27 years of age and your health care provider tells you that you are at risk for this type of infection. ? Your sexual activity has changed since you were last screened, and you are at increased risk for chlamydia or gonorrhea. Ask your health care provider if you are at risk.  Ask your health care provider about whether you are at high risk for HIV. Your health care provider may  recommend a prescription medicine to help prevent HIV infection. If you choose to take medicine to prevent HIV, you should first get tested for HIV. You should then be tested every 3 months for as long as you are taking the medicine. Pregnancy  If you are about to stop having your period (premenopausal) and you may become pregnant, seek counseling before you get pregnant.  Take 400 to 800 micrograms (mcg) of folic acid every day if you become pregnant.  Ask for birth control (contraception) if you want to prevent pregnancy. Osteoporosis and menopause Osteoporosis is a disease in which the bones lose minerals and strength with aging. This can result in bone fractures. If you are 107 years old or older, or if you are at risk for osteoporosis and fractures, ask your health care provider if you should:  Be screened for bone loss.  Take a calcium or vitamin D supplement to lower your risk of fractures.  Be given hormone replacement therapy (HRT) to treat symptoms of menopause. Follow these instructions at home: Lifestyle  Do not use any products that contain nicotine or tobacco, such as cigarettes, e-cigarettes, and chewing tobacco. If you need help quitting, ask your health care provider.  Do not use street drugs.  Do not share needles.  Ask your health care provider for help if you need support or information about quitting drugs. Alcohol use  Do not drink alcohol if: ? Your health care provider tells you not to drink. ? You are pregnant, may be pregnant, or are planning to become pregnant.  If you drink alcohol: ? Limit how much you use to 0-1 drink a day. ? Limit intake if you are breastfeeding.  Be aware of how much alcohol is in your drink. In the [place] one drink equals one 12 oz bottle of beer (355 mL), one 5 oz glass of wine (148 mL), or one 1 oz glass of hard liquor (44 mL). General instructions  Schedule regular health, dental, and eye exams.  Stay current with your  vaccines.  Tell your health care provider if: ? You often feel depressed. ? You have ever been abused or do not feel safe at home. Summary  Adopting a healthy lifestyle and getting preventive care are important in promoting health and wellness.  Follow your health care provider's instructions about healthy diet, exercising, and getting tested or screened for diseases.  Follow your health care provider's instructions on monitoring your cholesterol and blood pressure. This information is not intended to replace advice given to you by your health care provider. Make sure you discuss any questions you have with your health care provider. Document Revised: 11/21/2018 Document Reviewed: 11/21/2018 Elsevier Patient Education  2020 [employer].
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
## 2020-07-20 ENCOUNTER — Other Ambulatory Visit (HOSPITAL_COMMUNITY)
Admission: RE | Admit: 2020-07-20 | Discharge: 2020-07-20 | Disposition: A | Discharge status: Home / Self Care | Payer: Medicaid Other | Source: Ambulatory Visit | Attending: Family Medicine | Admitting: Family Medicine

## 2020-07-20 ENCOUNTER — Ambulatory Visit (INDEPENDENT_AMBULATORY_CARE_PROVIDER_SITE_OTHER): Payer: Self-pay | Admitting: Family Medicine

## 2020-07-20 ENCOUNTER — Other Ambulatory Visit: Payer: Self-pay

## 2020-07-20 ENCOUNTER — Encounter: Payer: Self-pay | Admitting: Family Medicine

## 2020-07-20 VITALS — BP 108/76 | HR 76 | Temp 98.7°F | Resp 16 | Ht 61.0 in | Wt 131.0 lb

## 2020-07-20 DIAGNOSIS — Z1329 Encounter for screening for other suspected endocrine disorder: Secondary | ICD-10-CM

## 2020-07-20 DIAGNOSIS — Z13220 Encounter for screening for lipoid disorders: Secondary | ICD-10-CM

## 2020-07-20 DIAGNOSIS — Z113 Encounter for screening for infections with a predominantly sexual mode of transmission: Secondary | ICD-10-CM

## 2020-07-20 DIAGNOSIS — A749 Chlamydial infection, unspecified: Secondary | ICD-10-CM

## 2020-07-20 DIAGNOSIS — F0781 Postconcussional syndrome: Secondary | ICD-10-CM

## 2020-07-20 DIAGNOSIS — F339 Major depressive disorder, recurrent, unspecified: Secondary | ICD-10-CM

## 2020-07-20 DIAGNOSIS — Z130 Encounter for screening for diseases of the blood and blood-forming organs and certain disorders involving the immune mechanism: Secondary | ICD-10-CM

## 2020-07-20 DIAGNOSIS — Z131 Encounter for screening for diabetes mellitus: Secondary | ICD-10-CM

## 2020-07-20 DIAGNOSIS — F324 Major depressive disorder, single episode, in partial remission: Secondary | ICD-10-CM

## 2020-07-20 LAB — CBC
HCT: 42.5 % (ref 36.0–46.0)
Hemoglobin: 14 g/dL (ref 12.0–15.0)
MCHC: 33 g/dL (ref 30.0–36.0)
MCV: 87.7 fl (ref 78.0–100.0)
Platelets: 278 10*3/uL (ref 150.0–400.0)
RBC: 4.84 Mil/uL (ref 3.87–5.11)
RDW: 13.3 % (ref 11.5–15.5)
WBC: 6.5 10*3/uL (ref 4.0–10.5)

## 2020-07-20 LAB — LIPID PANEL
Cholesterol: 151 mg/dL (ref 0–200)
HDL: 58 mg/dL (ref >39.00)
LDL Cholesterol: 85 mg/dL (ref 0–99)
NonHDL: 92.6
Total CHOL/HDL Ratio: 3
Triglycerides: 40 mg/dL (ref 0.0–149.0)
VLDL: 8 mg/dL (ref 0.0–40.0)

## 2020-07-20 LAB — COMPREHENSIVE METABOLIC PANEL
ALT: 14 U/L (ref 0–35)
AST: 19 U/L (ref 0–37)
Albumin: 4.3 g/dL (ref 3.5–5.2)
Alkaline Phosphatase: 53 U/L (ref 39–117)
BUN: 11 mg/dL (ref 6–23)
CO2: 27 mEq/L (ref 19–32)
Calcium: 9.4 mg/dL (ref 8.4–10.5)
Chloride: 102 mEq/L (ref 96–112)
Creatinine, Ser: 0.8 mg/dL (ref 0.40–1.20)
GFR: 103.67 mL/min (ref >60.00)
Glucose, Bld: 87 mg/dL (ref 70–99)
Potassium: 3.9 mEq/L (ref 3.5–5.1)
Sodium: 138 mEq/L (ref 135–145)
Total Bilirubin: 0.5 mg/dL (ref 0.2–1.2)
Total Protein: 7.2 g/dL (ref 6.0–8.3)

## 2020-07-20 LAB — TSH: TSH: 1.77 u[IU]/mL (ref 0.35–4.50)

## 2020-07-20 MED ORDER — FLUOXETINE HCL 20 MG PO CAPS: 20.0000 mg | ORAL_CAPSULE | Freq: Every day | ORAL | 3 refills | Status: DC

## 2020-07-20 MED ORDER — TOPIRAMATE 50 MG PO TABS: 50.0000 mg | ORAL_TABLET | Freq: Every day | ORAL | 1 refills | Status: DC

## 2020-07-21 LAB — HIV ANTIBODY (ROUTINE TESTING W REFLEX): HIV 1&2 Ab, 4th Generation: NONREACTIVE

## 2020-07-21 LAB — RPR: RPR Ser Ql: NONREACTIVE

## 2020-07-21 LAB — HEPATITIS C ANTIBODY
Hepatitis C Ab: NONREACTIVE
SIGNAL TO CUT-OFF: 0.02 (ref <1.00)

## 2020-07-22 LAB — URINE CYTOLOGY ANCILLARY ONLY
Chlamydia: POSITIVE — AB
Comment: NEGATIVE
Comment: NORMAL
Neisseria Gonorrhea: NEGATIVE

## 2020-07-23 MED ORDER — AZITHROMYCIN 250 MG PO TABS: ORAL_TABLET | ORAL | 0 refills | Status: DC

## 2020-07-23 NOTE — Addendum Note (Signed)
Addended by: Abbe Amsterdam C on: 07/23/2020 05:20 AM   Modules accepted: Orders

## 2020-12-15 ENCOUNTER — Other Ambulatory Visit: Payer: Self-pay | Admitting: Family Medicine

## 2020-12-15 DIAGNOSIS — A749 Chlamydial infection, unspecified: Secondary | ICD-10-CM
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
## 2022-03-23 NOTE — Patient Instructions (Addendum)
Good to see you again today!  I will be in touch with lab results  ?I recommend covid booster if not up to date  ? ?I made a referral for you to the [facility] in [place] for your headaches and neck pain  ?[facility] ?[address] ?[place], [place] [address] ?Phone: [phone] ? ?For depression please start on Pristiq- 50 mg daily- please let me know how this is working for you!  Seeing a counselor may be helpful ?If you are at any risk of self harm please seek care immediately- ok to go to the ER or call 911, or contact me  ? ?Please update me in about 2 weeks regarding how you are doing- sooner if not ok  ? ?

## 2022-03-28 ENCOUNTER — Ambulatory Visit (INDEPENDENT_AMBULATORY_CARE_PROVIDER_SITE_OTHER): Payer: BC Managed Care – PPO | Admitting: Family Medicine

## 2022-03-28 ENCOUNTER — Encounter: Payer: Self-pay | Admitting: Family Medicine

## 2022-03-28 VITALS — BP 110/80 | HR 94 | Temp 98.3°F | Resp 18 | Ht 60.0 in | Wt 117.6 lb

## 2022-03-28 DIAGNOSIS — Z13220 Encounter for screening for lipoid disorders: Secondary | ICD-10-CM

## 2022-03-28 DIAGNOSIS — Z130 Encounter for screening for diseases of the blood and blood-forming organs and certain disorders involving the immune mechanism: Secondary | ICD-10-CM | POA: Diagnosis not present

## 2022-03-28 DIAGNOSIS — Z131 Encounter for screening for diabetes mellitus: Secondary | ICD-10-CM | POA: Diagnosis not present

## 2022-03-28 DIAGNOSIS — F339 Major depressive disorder, recurrent, unspecified: Secondary | ICD-10-CM | POA: Diagnosis not present

## 2022-03-28 DIAGNOSIS — G8929 Other chronic pain: Secondary | ICD-10-CM

## 2022-03-28 DIAGNOSIS — R519 Headache, unspecified: Secondary | ICD-10-CM | POA: Diagnosis not present

## 2022-03-28 DIAGNOSIS — Z0000 Encounter for general adult medical examination without abnormal findings: Secondary | ICD-10-CM

## 2022-03-28 DIAGNOSIS — Z1329 Encounter for screening for other suspected endocrine disorder: Secondary | ICD-10-CM

## 2022-03-28 LAB — CBC
HCT: 41.1 % (ref 36.0–46.0)
Hemoglobin: 13.6 g/dL (ref 12.0–15.0)
MCHC: 33 g/dL (ref 30.0–36.0)
MCV: 86.8 fl (ref 78.0–100.0)
Platelets: 240 10*3/uL (ref 150.0–400.0)
RBC: 4.74 Mil/uL (ref 3.87–5.11)
RDW: 12.8 % (ref 11.5–15.5)
WBC: 10.1 10*3/uL (ref 4.0–10.5)

## 2022-03-28 LAB — COMPREHENSIVE METABOLIC PANEL
ALT: 10 U/L (ref 0–35)
AST: 16 U/L (ref 0–37)
Albumin: 4.4 g/dL (ref 3.5–5.2)
Alkaline Phosphatase: 61 U/L (ref 39–117)
BUN: 8 mg/dL (ref 6–23)
CO2: 26 mEq/L (ref 19–32)
Calcium: 9.1 mg/dL (ref 8.4–10.5)
Chloride: 103 mEq/L (ref 96–112)
Creatinine, Ser: 0.69 mg/dL (ref 0.40–1.20)
GFR: 117.41 mL/min (ref >60.00)
Glucose, Bld: 83 mg/dL (ref 70–99)
Potassium: 4 mEq/L (ref 3.5–5.1)
Sodium: 137 mEq/L (ref 135–145)
Total Bilirubin: 0.5 mg/dL (ref 0.2–1.2)
Total Protein: 6.8 g/dL (ref 6.0–8.3)

## 2022-03-28 LAB — HEMOGLOBIN A1C: Hgb A1c MFr Bld: 5.4 % (ref 4.6–6.5)

## 2022-03-28 LAB — LIPID PANEL
Cholesterol: 124 mg/dL (ref 0–200)
HDL: 44.3 mg/dL (ref >39.00)
LDL Cholesterol: 72 mg/dL (ref 0–99)
NonHDL: 79.78
Total CHOL/HDL Ratio: 3
Triglycerides: 38 mg/dL (ref 0.0–149.0)
VLDL: 7.6 mg/dL (ref 0.0–40.0)

## 2022-03-28 LAB — TSH: TSH: 0.82 u[IU]/mL (ref 0.35–5.50)

## 2022-03-28 MED ORDER — DESVENLAFAXINE SUCCINATE ER 50 MG PO TB24: 50.0000 mg | ORAL_TABLET | Freq: Every day | ORAL | 5 refills | Status: DC

## 2022-05-06 ENCOUNTER — Encounter: Payer: Self-pay | Admitting: Family Medicine
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
## 2023-08-30 NOTE — Patient Instructions (Signed)
It was great to see you again today, I will be in touch with your lab work  You got your flu shot today, recommend a COVID booster this fall  Lets have you try venlafaxine/Effexor 75 mg daily for anxiety symptoms.  Let me know how this works for you.  We can continue the same dose or go up in a couple of weeks depending how you feel.  You can update me via MyChart at your convenience!   When you settle on a dose of Effexor I can send in a 90-day prescription, I did 30 days for now to make sure you like it

## 2023-08-31 ENCOUNTER — Ambulatory Visit: Payer: 59 | Admitting: Family Medicine

## 2023-08-31 ENCOUNTER — Encounter: Payer: Self-pay | Admitting: Family Medicine

## 2023-08-31 VITALS — BP 116/80 | HR 89 | Temp 98.2°F | Resp 18 | Ht 60.0 in | Wt 126.0 lb

## 2023-08-31 DIAGNOSIS — Z13220 Encounter for screening for lipoid disorders: Secondary | ICD-10-CM

## 2023-08-31 DIAGNOSIS — Z131 Encounter for screening for diabetes mellitus: Secondary | ICD-10-CM | POA: Diagnosis not present

## 2023-08-31 DIAGNOSIS — Z130 Encounter for screening for diseases of the blood and blood-forming organs and certain disorders involving the immune mechanism: Secondary | ICD-10-CM | POA: Diagnosis not present

## 2023-08-31 DIAGNOSIS — Z0000 Encounter for general adult medical examination without abnormal findings: Secondary | ICD-10-CM | POA: Diagnosis not present

## 2023-08-31 DIAGNOSIS — Z23 Encounter for immunization: Secondary | ICD-10-CM

## 2023-08-31 DIAGNOSIS — F419 Anxiety disorder, unspecified: Secondary | ICD-10-CM

## 2023-08-31 DIAGNOSIS — Z1329 Encounter for screening for other suspected endocrine disorder: Secondary | ICD-10-CM

## 2023-08-31 LAB — CBC
HCT: 41.2 % (ref 36.0–46.0)
Hemoglobin: 13.5 g/dL (ref 12.0–15.0)
MCHC: 32.7 g/dL (ref 30.0–36.0)
MCV: 87 fl (ref 78.0–100.0)
Platelets: 298 10*3/uL (ref 150.0–400.0)
RBC: 4.74 Mil/uL (ref 3.87–5.11)
RDW: 12.9 % (ref 11.5–15.5)
WBC: 6.8 10*3/uL (ref 4.0–10.5)

## 2023-08-31 LAB — COMPREHENSIVE METABOLIC PANEL
ALT: 9 U/L (ref 0–35)
AST: 14 U/L (ref 0–37)
Albumin: 4.3 g/dL (ref 3.5–5.2)
Alkaline Phosphatase: 61 U/L (ref 39–117)
BUN: 9 mg/dL (ref 6–23)
CO2: 29 mEq/L (ref 19–32)
Calcium: 9.7 mg/dL (ref 8.4–10.5)
Chloride: 104 mEq/L (ref 96–112)
Creatinine, Ser: 0.75 mg/dL (ref 0.40–1.20)
GFR: 106.63 mL/min (ref >60.00)
Glucose, Bld: 77 mg/dL (ref 70–99)
Potassium: 4.4 mEq/L (ref 3.5–5.1)
Sodium: 141 mEq/L (ref 135–145)
Total Bilirubin: 0.6 mg/dL (ref 0.2–1.2)
Total Protein: 6.6 g/dL (ref 6.0–8.3)

## 2023-08-31 LAB — LIPID PANEL
Cholesterol: 155 mg/dL (ref 0–200)
HDL: 57 mg/dL (ref >39.00)
LDL Cholesterol: 88 mg/dL (ref 0–99)
NonHDL: 97.99
Total CHOL/HDL Ratio: 3
Triglycerides: 51 mg/dL (ref 0.0–149.0)
VLDL: 10.2 mg/dL (ref 0.0–40.0)

## 2023-08-31 LAB — TSH: TSH: 1.05 u[IU]/mL (ref 0.35–5.50)

## 2023-08-31 LAB — HEMOGLOBIN A1C: Hgb A1c MFr Bld: 5.2 % (ref 4.6–6.5)

## 2023-08-31 MED ORDER — VENLAFAXINE HCL ER 75 MG PO CP24
75.0000 mg | ORAL_CAPSULE | Freq: Every day | ORAL | 3 refills | Status: DC
Start: 2023-08-31 — End: 2024-01-02

## 2023-09-12 ENCOUNTER — Encounter: Payer: Self-pay | Admitting: Family Medicine

## 2023-10-07 ENCOUNTER — Other Ambulatory Visit: Payer: Self-pay | Admitting: Medical Genetics

## 2023-10-07 DIAGNOSIS — Z006 Encounter for examination for normal comparison and control in clinical research program: Secondary | ICD-10-CM

## 2023-10-17 ENCOUNTER — Encounter: Payer: Self-pay | Admitting: Family Medicine

## 2023-10-20 ENCOUNTER — Telehealth: Payer: Self-pay | Admitting: Family Medicine

## 2023-10-20 NOTE — Telephone Encounter (Signed)
Dr. Wallace Cullens ([facility]) called to have a consultation with Dr. 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

## 2023-10-23 ENCOUNTER — Telehealth: Payer: Self-pay | Admitting: Family Medicine

## 2023-10-23 ENCOUNTER — Other Ambulatory Visit (HOSPITAL_COMMUNITY)
Admission: RE | Admit: 2023-10-23 | Discharge: 2023-10-23 | Disposition: A | Discharge status: Home / Self Care | Payer: 59 | Source: Ambulatory Visit | Attending: Oncology | Admitting: Oncology

## 2023-10-23 DIAGNOSIS — Z006 Encounter for examination for normal comparison and control in clinical research program: Secondary | ICD-10-CM | POA: Insufficient documentation

## 2023-10-23 NOTE — Telephone Encounter (Signed)
Dr Wallace Cullens called back and we spoke.  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

## 2023-10-23 NOTE — Telephone Encounter (Signed)
Called and LMOM with DR Juanita Craver, gave him my cell number

## 2023-11-04 LAB — GENECONNECT MOLECULAR SCREEN

## 2023-11-04 LAB — HELIX MOLECULAR SCREEN: Genetic Analysis Overall Interpretation: NEGATIVE

## 2024-01-02 ENCOUNTER — Other Ambulatory Visit: Payer: Self-pay | Admitting: Family Medicine

## 2024-01-02 DIAGNOSIS — F419 Anxiety disorder, unspecified: Secondary | ICD-10-CM

## 2024-01-05 ENCOUNTER — Other Ambulatory Visit: Payer: Self-pay | Admitting: Family Medicine

## 2024-01-05 ENCOUNTER — Encounter: Payer: Self-pay | Admitting: Family Medicine

## 2024-01-05 DIAGNOSIS — F419 Anxiety disorder, unspecified: Secondary | ICD-10-CM

## 2024-04-07 ENCOUNTER — Other Ambulatory Visit: Payer: Self-pay | Admitting: Family Medicine

## 2024-04-07 DIAGNOSIS — F419 Anxiety disorder, unspecified: Secondary | ICD-10-CM

## 2024-07-10 ENCOUNTER — Other Ambulatory Visit: Payer: Self-pay | Admitting: Family Medicine

## 2024-07-10 DIAGNOSIS — F419 Anxiety disorder, unspecified: Secondary | ICD-10-CM

## 2024-09-07 NOTE — Patient Instructions (Incomplete)
 It was great to see you today, I will be in touch with your labs
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
## 2024-09-11 ENCOUNTER — Encounter: Payer: Self-pay | Admitting: Family Medicine

## 2024-09-11 ENCOUNTER — Ambulatory Visit: Admitting: Family Medicine

## 2024-09-11 VITALS — BP 122/80 | HR 85 | Temp 98.6°F | Ht 60.0 in | Wt 150.0 lb

## 2024-09-11 DIAGNOSIS — Z130 Encounter for screening for diseases of the blood and blood-forming organs and certain disorders involving the immune mechanism: Secondary | ICD-10-CM

## 2024-09-11 DIAGNOSIS — Z1329 Encounter for screening for other suspected endocrine disorder: Secondary | ICD-10-CM

## 2024-09-11 DIAGNOSIS — R5382 Chronic fatigue, unspecified: Secondary | ICD-10-CM

## 2024-09-11 DIAGNOSIS — Z13220 Encounter for screening for lipoid disorders: Secondary | ICD-10-CM | POA: Diagnosis not present

## 2024-09-11 DIAGNOSIS — Z23 Encounter for immunization: Secondary | ICD-10-CM

## 2024-09-11 DIAGNOSIS — Z0000 Encounter for general adult medical examination without abnormal findings: Secondary | ICD-10-CM

## 2024-09-11 DIAGNOSIS — Z131 Encounter for screening for diabetes mellitus: Secondary | ICD-10-CM | POA: Diagnosis not present

## 2024-09-11 DIAGNOSIS — F419 Anxiety disorder, unspecified: Secondary | ICD-10-CM | POA: Diagnosis not present

## 2024-09-11 LAB — LIPID PANEL
Cholesterol: 162 mg/dL (ref 0–200)
HDL: 47.8 mg/dL (ref >39.00)
LDL Cholesterol: 104 mg/dL — ABNORMAL HIGH (ref 0–99)
NonHDL: 114.14
Total CHOL/HDL Ratio: 3
Triglycerides: 51 mg/dL (ref 0.0–149.0)
VLDL: 10.2 mg/dL (ref 0.0–40.0)

## 2024-09-11 LAB — COMPREHENSIVE METABOLIC PANEL WITH GFR
ALT: 14 U/L (ref 0–35)
AST: 19 U/L (ref 0–37)
Albumin: 4.4 g/dL (ref 3.5–5.2)
Alkaline Phosphatase: 70 U/L (ref 39–117)
BUN: 9 mg/dL (ref 6–23)
CO2: 28 meq/L (ref 19–32)
Calcium: 9.5 mg/dL (ref 8.4–10.5)
Chloride: 103 meq/L (ref 96–112)
Creatinine, Ser: 0.76 mg/dL (ref 0.40–1.20)
GFR: 104.19 mL/min (ref >60.00)
Glucose, Bld: 84 mg/dL (ref 70–99)
Potassium: 4.2 meq/L (ref 3.5–5.1)
Sodium: 138 meq/L (ref 135–145)
Total Bilirubin: 0.5 mg/dL (ref 0.2–1.2)
Total Protein: 6.9 g/dL (ref 6.0–8.3)

## 2024-09-11 LAB — CBC
HCT: 41.7 % (ref 36.0–46.0)
Hemoglobin: 13.7 g/dL (ref 12.0–15.0)
MCHC: 32.8 g/dL (ref 30.0–36.0)
MCV: 85.8 fl (ref 78.0–100.0)
Platelets: 306 K/uL (ref 150.0–400.0)
RBC: 4.86 Mil/uL (ref 3.87–5.11)
RDW: 13.6 % (ref 11.5–15.5)
WBC: 7.4 K/uL (ref 4.0–10.5)

## 2024-09-11 LAB — VITAMIN D 25 HYDROXY (VIT D DEFICIENCY, FRACTURES): VITD: 28 ng/mL — ABNORMAL LOW (ref 30.00–100.00)

## 2024-09-11 LAB — POCT URINE PREGNANCY: Preg Test, Ur: NEGATIVE

## 2024-09-11 LAB — TSH: TSH: 1.39 u[IU]/mL (ref 0.35–5.50)

## 2024-09-11 LAB — HEMOGLOBIN A1C: Hgb A1c MFr Bld: 5.5 % (ref 4.6–6.5)

## 2024-09-11 LAB — VITAMIN B12: Vitamin B-12: 300 pg/mL (ref 211–911)

## 2024-09-11 LAB — FERRITIN: Ferritin: 21.8 ng/mL (ref 10.0–291.0)

## 2024-09-11 MED ORDER — VENLAFAXINE HCL ER 75 MG PO CP24: 75.0000 mg | ORAL_CAPSULE | Freq: Every day | ORAL | 3 refills | Status: AC

## 2024-09-12 ENCOUNTER — Encounter: Payer: Self-pay | Admitting: Family Medicine

## 2024-09-26 ENCOUNTER — Encounter: Payer: Self-pay | Admitting: Family Medicine

## 2024-10-14 ENCOUNTER — Ambulatory Visit (INDEPENDENT_AMBULATORY_CARE_PROVIDER_SITE_OTHER): Admitting: Neurology

## 2024-10-14 ENCOUNTER — Encounter: Payer: Self-pay | Admitting: Neurology

## 2024-10-14 VITALS — BP 123/75 | HR 71 | Ht 60.0 in | Wt 150.0 lb

## 2024-10-14 DIAGNOSIS — Z820 Family history of epilepsy and other diseases of the nervous system: Secondary | ICD-10-CM

## 2024-10-14 DIAGNOSIS — R0683 Snoring: Secondary | ICD-10-CM | POA: Diagnosis not present

## 2024-10-14 DIAGNOSIS — R519 Headache, unspecified: Secondary | ICD-10-CM

## 2024-10-14 DIAGNOSIS — R635 Abnormal weight gain: Secondary | ICD-10-CM

## 2024-10-14 DIAGNOSIS — E663 Overweight: Secondary | ICD-10-CM

## 2024-10-14 DIAGNOSIS — G4750 Parasomnia, unspecified: Secondary | ICD-10-CM

## 2024-10-14 DIAGNOSIS — G4719 Other hypersomnia: Secondary | ICD-10-CM

## 2024-10-14 NOTE — Patient Instructions (Signed)
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
## 2024-10-30 ENCOUNTER — Telehealth: Payer: Self-pay | Admitting: Neurology

## 2024-10-30 NOTE — Telephone Encounter (Signed)
 NPSG [employer] pending & [facility] [employer] pending

## 2024-11-11 NOTE — Telephone Encounter (Signed)
 [employer] denied the NPSG see below for the denial.  HST [employer] no auth req [facility] [employer] pending for HST.

## 2024-11-11 NOTE — Telephone Encounter (Signed)
 HST [facility] [employer] no auth req via fax
